# Patient Record
Sex: Female | Born: 1995 | Race: White | Hispanic: No | Marital: Married | Smoking: Never smoker
Health system: Southern US, Community
[De-identification: ages and names within clinical notes are randomized; demographics above are authoritative.]

## PROBLEM LIST (undated history)

## (undated) DIAGNOSIS — R55 Syncope and collapse: Secondary | ICD-10-CM

## (undated) DIAGNOSIS — K219 Gastro-esophageal reflux disease without esophagitis: Secondary | ICD-10-CM

## (undated) DIAGNOSIS — T7840XA Allergy, unspecified, initial encounter: Secondary | ICD-10-CM

## (undated) DIAGNOSIS — J45909 Unspecified asthma, uncomplicated: Secondary | ICD-10-CM

## (undated) DIAGNOSIS — G43909 Migraine, unspecified, not intractable, without status migrainosus: Secondary | ICD-10-CM

## (undated) DIAGNOSIS — N159 Renal tubulo-interstitial disease, unspecified: Secondary | ICD-10-CM

## (undated) HISTORY — DX: Renal tubulo-interstitial disease, unspecified: N15.9

## (undated) HISTORY — PX: WISDOM TOOTH EXTRACTION: SHX21

## (undated) HISTORY — DX: Migraine, unspecified, not intractable, without status migrainosus: G43.909

## (undated) HISTORY — PX: MOUTH SURGERY: SHX715

## (undated) HISTORY — DX: Allergy, unspecified, initial encounter: T78.40XA

## (undated) HISTORY — DX: Syncope and collapse: R55

## (undated) HISTORY — DX: Gastro-esophageal reflux disease without esophagitis: K21.9

---
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
## 2016-01-09 ENCOUNTER — Encounter: Payer: Self-pay | Admitting: Allergy and Immunology

## 2016-01-09 ENCOUNTER — Ambulatory Visit (INDEPENDENT_AMBULATORY_CARE_PROVIDER_SITE_OTHER): Payer: Medicaid Other | Admitting: Allergy and Immunology

## 2016-01-09 VITALS — BP 100/70 | HR 90 | Temp 98.0°F | Resp 16

## 2016-01-09 DIAGNOSIS — J4550 Severe persistent asthma, uncomplicated: Secondary | ICD-10-CM | POA: Diagnosis not present

## 2016-01-09 DIAGNOSIS — J3089 Other allergic rhinitis: Secondary | ICD-10-CM | POA: Diagnosis not present

## 2016-01-09 MED ORDER — FEXOFENADINE HCL 180 MG PO TABS: 180.0000 mg | ORAL_TABLET | Freq: Every day | ORAL | Status: DC

## 2016-01-09 MED ORDER — MOMETASONE FURO-FORMOTEROL FUM 200-5 MCG/ACT IN AERO: INHALATION_SPRAY | RESPIRATORY_TRACT | Status: DC

## 2016-01-09 MED ORDER — ALBUTEROL SULFATE HFA 108 (90 BASE) MCG/ACT IN AERS: INHALATION_SPRAY | RESPIRATORY_TRACT | Status: DC

## 2016-01-09 MED ORDER — MONTELUKAST SODIUM 10 MG PO TABS: ORAL_TABLET | ORAL | Status: DC

## 2016-01-09 NOTE — Progress Notes (Addendum)
FOLLOW UP NOTE  RE: Elizabeth Booker MRN: [id number] DOB: 13-Aug-1995 [facility] [place] [address] [place] [id number] Date of Office Visit: 01/09/2016  Subjective:  Elizabeth Booker is a 20 y.o. female who presents today for Asthma; Cough; and Wheezing  Assessment:   1. Severe persistent asthma, recent intermittent symptoms with normal lung exam and in office spirometry today--cough/wheeze free in office.   ----Total IgE=619.  2. Allergic rhinoconjunctivitis, intermittent congestion, on immunotherapy.   3.      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 Plan:   Meds ordered this encounter  Medications  . albuterol (PROAIR HFA) 108 (90 Base) MCG/ACT inhaler    Sig: Use 2 puffs every four hours as needed for cough or wheeze.  May use 2 puffs 10-20 minutes prior to exercise.    Dispense:  1 Inhaler    Refill:  1  . mometasone-formoterol (DULERA) 200-5 MCG/ACT AERO    Sig: Use 2 puffs twice daily to prevent cough or wheeze.  Rinse, gargle and spit after use.    Dispense:  5 g    Refill:  1  . montelukast (SINGULAIR) 10 MG tablet    Sig: Take one each evening to prevent cough or wheeze.    Dispense:  34 tablet    Refill:  5  . fexofenadine (ALLEGRA) 180 MG tablet    Sig: Take 1 tablet (180 mg total) by mouth daily.    Dispense:  30 tablet    Refill:  3  . EPINEPHrine 0.3 mg/0.3 mL IJ SOAJ injection    Sig: Inject 0.3 mLs (0.3 mg total) into the muscle once.    Dispense:  2 Device    Refill:  1  . omeprazole (PRILOSEC) 20 MG capsule    Sig: Take 1 capsule (20 mg total) by mouth daily.    Dispense:  30 capsule    Refill:  2  . predniSONE (DELTASONE) 10 MG tablet    Sig: Take 3 tablets daily for 2 days then 2 tablets daily for 2 days and one tablet on last day.    Dispense:  11 tablet    Refill:  0  1.    Further review of Xolair option, additional written brochure given today and our in office protocol discussed  today. 2.    Prednisone 30mg  today and additional days if persisting symptoms as discussed. 3.    QVAR 4 puffs twice daily until 100% well (spacer). 4.    Albuterol neb/ProAir HFA every 4 hours as needed--will use twice daily over the next several days. 5.    Continue Singulair, Dulera (spacer), and Flonase daily. 6.    Use Allegra 180mg  once daily and stop Zyrtec. 7.    Saline nasal wash each evening at shower time. 8.    She will use Omeprazole 20mg  once each morning during the next week in particular while taking Prednisone. 9.    Hold immunotherapy until symptom free--Epi-pen/Benadryl per protocol. 10.  Follow-up in the next month or sooner if needed.  HPI:  Elizabeth Booker returns to the office with Mom reporting intermittent cough, congestion and wheeze.  Since her last visit with Dr. Nunzio CobbsBobbitt in January she had not followed up as medications were beneficial and no new concerns.  She is receiving immunotherapy without any large local or systemic reactions. However reports symptoms over the last 2 months with the warmer weather.  [place]eze and slight shortness of breath with activity, therefore started QVAR yesterday evening along with her other medications.  She has noted nasal congestion without headache, sore throat, fever or discolored drainage--though has not been using her Flonase.  Mom was interested in more details regarding Xolair, and the potential side effects.  Elizabeth Booker is sleeping well, without nocturnal awakenings and has normal appetite. Denies ED or urgent care visits, prednisone or antibiotic courses since her visit in January.   Elizabeth Booker has a current medication list which includes the following prescription(s): albuterol neb, albuterol, fluticasone, ibuprofen, mometasone-formoterol, montelukast, OCP, qvar, diphenhydramine.  Drug Allergies: Allergies  Allergen Reactions  . Cucumber Extract Anaphylaxis  . Mango Flavor Anaphylaxis     fruit  . Onion Anaphylaxis    raw  . Other Anaphylaxis    cantaloupe  . Watermelon Concentrate [Citrullus Vulgaris] Anaphylaxis   Objective:   Filed Vitals:   01/09/16 1344  BP: 100/70  Pulse: 90  Temp: 98 F (36.7 C)  Resp: 16   SpO2 Readings from Last 1 Encounters:  01/09/16 98%   Physical Exam  Constitutional: She is well-developed, well-nourished, and in no distress.  Alert interactive communicating easily in full sentences.  HENT:  Head: Atraumatic.  Right Ear: Tympanic membrane and ear canal normal.  Left Ear: Tympanic membrane and ear canal normal.  Nose: Mucosal edema present. No rhinorrhea. No epistaxis.  Mouth/Throat: Oropharynx is clear and moist and mucous membranes are normal. No oropharyngeal exudate, posterior oropharyngeal edema or posterior oropharyngeal erythema.  Neck: Neck supple.  Cardiovascular: Normal rate, S1 normal and S2 normal.   No murmur heard. Pulmonary/Chest: Effort normal. She has no wheezes. She has no rhonchi. She has no rales.  Post Xopenex/Atrovent neb: Continues to be clear.  Patient without wheeze, rhonchi or crackles.  Lymphadenopathy:    She has no cervical adenopathy.   Diagnostics: Spirometry:  FVC  3.30--92%, FEV1 2.84--88%, FEF25--75%--70%; postbronchodilator essentially no change.    Charelle Petrakis M. Willa RoughHicks, MD  cc: Norman ClayLOWE,MELISSA V, MD

## 2016-01-09 NOTE — Patient Instructions (Addendum)
  Further review of Xolair option.  Prednisone 30mg  in today.  QVAR 4 puffs daily until well.  Albuterol neb/ProAir HFA every 4 hours as needed.  Continue Singulair, Dulera, and Flonase daily.  Allegra 180mg  once daily.  Saline nasal wash each evening at shower time.  Follow-up in the next month or sooner if needed.

## 2016-01-10 MED ORDER — OMEPRAZOLE 20 MG PO CPDR: 20.0000 mg | DELAYED_RELEASE_CAPSULE | Freq: Every day | ORAL | Status: DC

## 2016-01-10 MED ORDER — EPINEPHRINE 0.3 MG/0.3ML IJ SOAJ: 0.3000 mg | Freq: Once | INTRAMUSCULAR | Status: DC

## 2016-01-10 MED ORDER — PREDNISONE 10 MG PO TABS: ORAL_TABLET | ORAL | Status: DC

## 2016-01-11 ENCOUNTER — Emergency Department (HOSPITAL_COMMUNITY): Payer: Medicaid Other

## 2016-01-11 ENCOUNTER — Emergency Department (HOSPITAL_COMMUNITY)
Admission: EM | Admit: 2016-01-11 | Discharge: 2016-01-11 | Disposition: A | Discharge status: Home / Self Care | Payer: Medicaid Other | Attending: Emergency Medicine | Admitting: Emergency Medicine

## 2016-01-11 ENCOUNTER — Encounter (HOSPITAL_COMMUNITY): Payer: Self-pay

## 2016-01-11 DIAGNOSIS — Z79899 Other long term (current) drug therapy: Secondary | ICD-10-CM | POA: Diagnosis not present

## 2016-01-11 DIAGNOSIS — J209 Acute bronchitis, unspecified: Secondary | ICD-10-CM | POA: Insufficient documentation

## 2016-01-11 DIAGNOSIS — J45909 Unspecified asthma, uncomplicated: Secondary | ICD-10-CM | POA: Diagnosis present

## 2016-01-11 DIAGNOSIS — J40 Bronchitis, not specified as acute or chronic: Secondary | ICD-10-CM

## 2016-01-11 LAB — D-DIMER, QUANTITATIVE (NOT AT ARMC)

## 2016-01-11 MED ORDER — IPRATROPIUM-ALBUTEROL 0.5-2.5 (3) MG/3ML IN SOLN
3.0000 mL | Freq: Once | RESPIRATORY_TRACT | Status: AC
  Administered 2016-01-11: 3 mL via RESPIRATORY_TRACT
  Filled 2016-01-11: qty 3

## 2016-01-11 MED ORDER — AZITHROMYCIN 250 MG PO TABS: ORAL_TABLET | ORAL | Status: DC

## 2016-01-11 NOTE — ED Notes (Signed)
Had albuterol neb treatment at 0930

## 2016-01-11 NOTE — ED Notes (Addendum)
Patient here with asthma attack. Been using steroids and nebs with minimal relief. Increased coughing and chest tightness for same. Speaking but coughing on assessment. NAD

## 2016-01-11 NOTE — ED Provider Notes (Signed)
CSN: [id number]     Arrival date & time 01/11/16  1104 History   First MD Initiated Contact with Patient 01/11/16 1154     Chief Complaint  Patient presents with  . Asthma   (Consider location/radiation/quality/duration/timing/severity/associated sxs/prior Treatment) HPI 20 y.o. female with a hx of Asthma, presents to the Emergency Department today complaining of shortness of breath x 4 days. Has been using neb home treatments every day since then with minimal relief. Last treatment was 0930 this AM. States that she went to the Asthma clinic on Friday and was given Rx Prednisone. She has taken 3 tablets on the initial day, took another 3 the next day, and has yet to take the next 3 tablets today. Notes diffuse chest pressure that is 6/10. Pt states that she had a temp yesterday of 100F. Dry cough. No N/V/D. No headaches. No abd pain. No other symptoms noted   Past Medical History  Diagnosis Date  . Asthma    History reviewed. No pertinent past surgical history. No family history on file. Social History  Substance Use Topics  . Smoking status: Never Smoker   . Smokeless tobacco: None  . Alcohol Use: No   OB History    No data available     Review of Systems ROS reviewed and all are negative for acute change except as noted in the HPI.  Allergies  Cucumber extract; Mango flavor; Onion; Other; and Watermelon concentrate  Home Medications   Prior to Admission medications   Medication Sig Start Date End Date Taking? Authorizing Provider  albuterol (PROAIR HFA) 108 (90 Base) MCG/ACT inhaler Use 2 puffs every four hours as needed for cough or wheeze.  May use 2 puffs 10-20 minutes prior to exercise. 01/09/16   Roselyn Kara Mead, MD  albuterol (PROVENTIL) (2.5 MG/3ML) 0.083% nebulizer solution Take 2.5 mg by nebulization every 6 (six) hours as needed for wheezing or shortness of breath.    Historical Provider, MD  amoxicillin-clavulanate (AUGMENTIN) 875-125 MG tablet Take 1 tablet twice  daily for 10 days. Patient not taking: Reported on 01/09/2016 08/11/15   Cristal Ford, MD  diphenhydrAMINE (BENADRYL) 25 MG tablet Take 50 mg by mouth every 6 (six) hours as needed. For allergies    Historical Provider, MD  EPINEPHrine 0.3 mg/0.3 mL IJ SOAJ injection Inject 0.3 mLs (0.3 mg total) into the muscle once. 01/10/16   Roselyn Kara Mead, MD  fexofenadine (ALLEGRA) 180 MG tablet Take 1 tablet (180 mg total) by mouth daily. 01/09/16   Roselyn Kara Mead, MD  fluticasone (FLONASE) 50 MCG/ACT nasal spray Place into both nostrils daily.    Historical Provider, MD  fluticasone-salmeterol (ADVAIR HFA) 230-21 MCG/ACT inhaler Inhale 2 puffs into the lungs 2 (two) times daily. Reported on 01/09/2016    Historical Provider, MD  guaiFENesin (MUCINEX) 600 MG 12 hr tablet Take 600 mg by mouth 2 (two) times daily. Reported on 01/09/2016    Historical Provider, MD  ibuprofen (ADVIL,MOTRIN) 200 MG tablet Take 400 mg by mouth every 6 (six) hours as needed. For pain    Historical Provider, MD  levocetirizine (XYZAL) 5 MG tablet Take 1 tablet (5 mg total) by mouth every evening. Patient not taking: Reported on 01/09/2016 11/06/15   Cristal Ford, MD  mometasone-formoterol [facility]) 200-5 MCG/ACT AERO Use 2 puffs twice daily to prevent cough or wheeze.  Rinse, gargle and spit after use. 01/09/16   Roselyn Kara Mead, MD  montelukast (SINGULAIR) 10 MG tablet Take one each  evening to prevent cough or wheeze. 01/09/16   Roselyn Kara MeadM Hicks, MD  omeprazole (PRILOSEC) 20 MG capsule Take 1 capsule (20 mg total) by mouth daily. 01/10/16   Roselyn Kara MeadM Hicks, MD  predniSONE (DELTASONE) 10 MG tablet Take 3 tablets daily for 2 days then 2 tablets daily for 2 days and one tablet on last day. 01/10/16   Roselyn Kara MeadM Hicks, MD  PRESCRIPTION MEDICATION Take 1 tablet by mouth daily. Birth control    Historical Provider, MD  PROVENTIL HFA 108 (90 Base) MCG/ACT inhaler INHALE TWO PUFFS BY MOUTH EVERY 6 HOURS AS NEEDED FOR SHORTNESS OF BREATH OR   WHEEZING. Patient not taking: Reported on 01/09/2016 10/02/15   Cristal Fordalph Carter Bobbitt, MD  QVAR 80 MCG/ACT inhaler INHALE TWO PUFFS BY MOUTH TWICE DAILY DURING  FLARE-UP  AS  DIRECTED.  RINSE,  GARGLE  AND  SPIT  AFTER  USE 12/01/15   Cristal Fordalph Carter Bobbitt, MD   BP 117/80 mmHg  Pulse 99  Temp(Src) 98.3 F (36.8 C) (Oral)  Resp 18  SpO2 100%  LMP 12/21/2015 (Approximate)   Physical Exam  Constitutional: She is oriented to person, place, and time. She appears well-developed and well-nourished.  HENT:  Head: Normocephalic and atraumatic.  Eyes: EOM are normal. Pupils are equal, round, and reactive to light.  Neck: Normal range of motion. Neck supple. No tracheal deviation present.  Cardiovascular: Normal rate, regular rhythm and normal heart sounds.   No murmur heard. Pulmonary/Chest: Effort normal and breath sounds normal. She has no decreased breath sounds. She has no wheezes. She has no rhonchi. She has no rales.  Abdominal: Soft. Normal appearance and bowel sounds are normal. There is no tenderness. There is no rigidity, no rebound, no guarding, no tenderness at McBurney's point and negative Murphy's sign.  Musculoskeletal: Normal range of motion.  Neurological: She is alert and oriented to person, place, and time.  Skin: Skin is warm and dry.  Psychiatric: She has a normal mood and affect. Her behavior is normal. Thought content normal.  Nursing note and vitals reviewed.  ED Course  Procedures (including critical care time) Labs Review Labs Reviewed  D-DIMER, QUANTITATIVE (NOT AT [facility])    Imaging Review Dg Chest 2 View  01/11/2016  CLINICAL DATA:  Cough and short of breath EXAM: CHEST  2 VIEW COMPARISON:  06/27/2013 FINDINGS: The heart size and mediastinal contours are within normal limits. Both lungs are clear. The visualized skeletal structures are unremarkable. IMPRESSION: No active cardiopulmonary disease. Electronically Signed   By: Marlan Palauharles  Clark M.D.   On: 01/11/2016 11:59    I have personally reviewed and evaluated these images and lab results as part of my medical decision-making.   EKG Interpretation None      MDM  I have reviewed and evaluated the relevant laboratory valuesI have reviewed and evaluated the relevant imaging studies.  I have reviewed the relevant previous healthcare records. I obtained HPI from historian. Patient discussed with supervising physician  ED Course:  Assessment: Pt is a 19yF with hx Asthma who presents with asthma exacerbation x 4 days despite neb treatments at home and prednisone from asthma clinic on Friday. On exam, pt in NAD. Nontoxic/nonseptic appearing. VSS. Afebrile. Lungs CTA with no appreciable wheeze. Heart RRR. Abdomen nontender soft. CXR unremarkable. D Dimer negative. Supervising Physician has seen and evaluated patient. Auscultated wheeze on right base on exam. Likely asthma exacerbation vs walking PNA. Plan is to DC Home with ABX and follow up to PCP.  At time of discharge, Patient is in no acute distress. Vital Signs are stable. Patient is able to ambulate. Patient able to tolerate PO.    Disposition/Plan:  DC Home Additional Verbal discharge instructions given and discussed with patient.  Pt Instructed to f/u with PCP in the next week for evaluation and treatment of symptoms. Return precautions given Pt acknowledges and agrees with plan  Supervising Physician Arby BarretteMarcy Pfeiffer, MD   Final diagnoses:  Bronchitis      Audry Piliyler Mclean Moya, PA-C 01/11/16 1439  Arby BarretteMarcy Pfeiffer, MD 01/18/16 [id number]

## 2016-01-11 NOTE — Discharge Instructions (Signed)
Please read and follow all provided instructions.  Your diagnoses today include:  1. Bronchitis    Tests performed today include:  Vital signs. See below for your results today.   Medications prescribed:   Take as prescribed   Home care instructions:  Follow any educational materials contained in this packet.  Follow-up instructions: Please follow-up with your primary care provider for further evaluation of symptoms and treatment   Return instructions:   Please return to the Emergency Department if you do not get better, if you get worse, or new symptoms OR  - Fever (temperature greater than 101.57F)  - Bleeding that does not stop with holding pressure to the area    -Severe pain (please note that you may be more sore the day after your accident)  - Chest Pain  - Difficulty breathing  - Severe nausea or vomiting  - Inability to tolerate food and liquids  - Passing out  - Skin becoming red around your wounds  - Change in mental status (confusion or lethargy)  - New numbness or weakness     Please return if you have any other emergent concerns.  Additional Information:  Your vital signs today were: BP 110/67 mmHg   Pulse 93   Temp(Src) 98.3 F (36.8 C) (Oral)   Resp 18   SpO2 97%   LMP 12/21/2015 (Approximate) If your blood pressure (BP) was elevated above 135/85 this visit, please have this repeated by your doctor within one month. ---------------

## 2016-01-14 ENCOUNTER — Encounter: Payer: Self-pay | Admitting: Allergy and Immunology

## 2016-01-14 ENCOUNTER — Ambulatory Visit (INDEPENDENT_AMBULATORY_CARE_PROVIDER_SITE_OTHER): Payer: Medicaid Other | Admitting: Allergy and Immunology

## 2016-01-14 VITALS — BP 100/70 | HR 85 | Temp 97.8°F | Resp 16

## 2016-01-14 DIAGNOSIS — J3089 Other allergic rhinitis: Secondary | ICD-10-CM

## 2016-01-14 DIAGNOSIS — J4541 Moderate persistent asthma with (acute) exacerbation: Secondary | ICD-10-CM

## 2016-01-14 DIAGNOSIS — K219 Gastro-esophageal reflux disease without esophagitis: Secondary | ICD-10-CM | POA: Diagnosis not present

## 2016-01-14 MED ORDER — IPRATROPIUM-ALBUTEROL 0.5-2.5 (3) MG/3ML IN SOLN: 3.0000 mL | Freq: Four times a day (QID) | RESPIRATORY_TRACT | Status: DC | PRN

## 2016-01-14 MED ORDER — METHYLPREDNISOLONE ACETATE 40 MG/ML IJ SUSP
40.0000 mg | Freq: Once | INTRAMUSCULAR | Status: AC
  Administered 2016-01-14: 40 mg via INTRAMUSCULAR

## 2016-01-14 NOTE — Patient Instructions (Addendum)
  1. Depo-Medrol 40 I am delivered in clinic today  2. Can use DuoNeb nebulization at home every 4-6 hours if needed. Replaces albuterol  3. Continue Dulera 200 - 2 inhalations twice a day  4. Add in Qvar 80 - 2 inhalations twice a day during "flareup"  5. Finish course of azithromycin  6. Continue nasal fluticasone 1-2 sprays each nostril daily  7. Continue montelukast 10 mg daily and omeprazole 20 mg daily  8. Continue antihistamine, ibuprofen, Mucinex DM if needed  9. Continue immunotherapy and EpiPen  10. Further evaluation?  11. Get fall flu vaccine  12. Return to clinic in November or earlier if problem

## 2016-01-14 NOTE — Progress Notes (Signed)
Follow-up Note  Referring Provider: Loyola Mast, MD Primary Provider: Norman Clay, MD Date of Office Visit: 01/14/2016  Subjective:   Elizabeth Booker (DOB: 03-24-1996) is a 20 y.o. female who [facility] in re-evaluation of the following:  HPI: Delma presents to this clinic in reevaluation of a persistent respiratory tract flare that has been present for the past week. She developed problems with shortness of breath and coughing and rhinorrhea and sneezing with a low-grade temperature up to 100 for which she sought out care with Dr. Willa Rough on Friday who treated her with prednisone 30 mg followed by a tapering dose. She really did not have much improvement while utilizing this plan and ended up going to the emergency room where a chest x-ray was normal and apparently a test for pulmonary embolus was normal and she was given azithromycin. Presently she still feels as though she short of breath especially if she exerts herself to any degree. Fortunately, her nose is better. She has no anosmia or decreased ability to taste or ugly nasal discharge and she's not making any ugly sputum production or have any chest pain.  Apparently she had a flareup in March for which she saw Dr. Nunzio Cobbs and had her Advair changed to [facility] and did relatively well up until this event. She continues on immunotherapy and a large collection of anti-inflammatory medications for her reflux and atopic respiratory disease and for the most part this plan has been working quite well.    Medication List           albuterol 108 (90 Base) MCG/ACT inhaler  Commonly known as:  PROAIR HFA  Use 2 puffs every four hours as needed for cough or wheeze.  May use 2 puffs 10-20 minutes prior to exercise.     albuterol (2.5 MG/3ML) 0.083% nebulizer solution  Commonly known as:  PROVENTIL  Take 2.5 mg by nebulization every 6 (six) hours as needed for wheezing or shortness of breath.       azithromycin 250 MG tablet  Commonly known as:  ZITHROMAX Z-PAK   PO once daily on day 1. THEN  PO once daily for 4 days     EPINEPHrine 0.3 mg/0.3 mL Soaj injection  Commonly known as:  EPI-PEN  Inject 0.3 mLs (0.3 mg total) into the muscle once.     fexofenadine 180 MG tablet  Commonly known as:  ALLEGRA  Take 1 tablet (180 mg total) by mouth daily.     fluticasone 50 MCG/ACT nasal spray  Commonly known as:  FLONASE  Place into both nostrils daily.     ibuprofen 200 MG tablet  Commonly known as:  ADVIL,MOTRIN  Take 400 mg by mouth every 6 (six) hours as needed. For pain     mometasone-formoterol 200-5 MCG/ACT Aero  Commonly known as:  DULERA  Use 2 puffs twice daily to prevent cough or wheeze.  Rinse, gargle and spit after use.     montelukast 10 MG tablet  Commonly known as:  SINGULAIR  Take one each evening to prevent cough or wheeze.     omeprazole 20 MG capsule  Commonly known as:  PRILOSEC  Take 1 capsule (20 mg total) by mouth daily.     predniSONE 10 MG tablet  Commonly known as:  DELTASONE  Take 3 tablets daily for 2 days then 2 tablets daily for 2 days and one tablet on last day.  Past Medical History  Diagnosis Date  . Asthma     No past surgical history on file.  Allergies  Allergen Reactions  . Cucumber Extract Anaphylaxis  . Mango Flavor Anaphylaxis    fruit  . Onion Anaphylaxis    raw  . Other Anaphylaxis    cantaloupe  . Watermelon Concentrate [Citrullus Vulgaris] Anaphylaxis    Review of systems negative except as noted in HPI / PMHx or noted below:  Review of Systems  Constitutional: Negative.   HENT: Negative.   Eyes: Negative.   Respiratory: Negative.   Cardiovascular: Negative.   Gastrointestinal: Negative.   Genitourinary: Negative.   Musculoskeletal: Negative.   Skin: Negative.   Neurological: Negative.   Endo/Heme/Allergies: Negative.   Psychiatric/Behavioral: Negative.      Objective:   Filed  Vitals:   01/14/16 0956  BP: 100/70  Pulse: 85  Temp: 97.8 F (36.6 C)  Resp: 16          Physical Exam  Constitutional: She is well-developed, well-nourished, and in no distress.  HENT:  Head: Normocephalic.  Right Ear: Tympanic membrane, external ear and ear canal normal.  Left Ear: Tympanic membrane, external ear and ear canal normal.  Nose: Nose normal. No mucosal edema or rhinorrhea.  Mouth/Throat: Uvula is midline, oropharynx is clear and moist and mucous membranes are normal. No oropharyngeal exudate.  Eyes: Conjunctivae are normal.  Neck: Trachea normal. No tracheal tenderness present. No tracheal deviation present. No thyromegaly present.  Cardiovascular: Normal rate, regular rhythm, S1 normal, S2 normal and normal heart sounds.   No murmur heard. Pulmonary/Chest: Breath sounds normal. No stridor. No respiratory distress. She has no wheezes. She has no rales.  Musculoskeletal: She exhibits no edema.  Lymphadenopathy:       Head (right side): No tonsillar adenopathy present.       Head (left side): No tonsillar adenopathy present.    She has no cervical adenopathy.  Neurological: She is alert. Gait normal.  Skin: No rash noted. She is not diaphoretic. No erythema. Nails show no clubbing.  Psychiatric: Mood and affect normal.    Diagnostics:    Spirometry was performed and demonstrated an FEV1 of 2.79 at 95 % of predicted.  The patient had an Asthma Control Test with the following results:  .    Assessment and Plan:   1. Asthma, not well controlled, moderate persistent, with acute exacerbation   2. Allergic rhinoconjunctivitis   3. Gastroesophageal reflux disease, esophagitis presence not specified     1. Depo-Medrol 40 IM delivered in clinic today  2. Can use DuoNeb nebulization at home every 4-6 hours if needed. Replaces albuterol  3. Continue Dulera 200 - 2 inhalations twice a day  4. Add in Qvar 80 - 2 inhalations twice a day during "flareup"  5.  Finish course of azithromycin  6. Continue nasal fluticasone 1-2 sprays each nostril daily  7. Continue montelukast 10 mg daily and omeprazole 20 mg daily  8. Continue antihistamine, ibuprofen, Mucinex DM if needed  9. Continue immunotherapy and EpiPen  10. Further evaluation?  11. Get fall flu vaccine  12. Return to clinic in November or earlier if problem  Konrad FelixKatelyn most likely has a viral trigger giving rise to her respiratory tract flare although certainly azithromycin will cover the possibility of mycoplasma infection. I think we just need to support her along for the next several days and then she'll resolve this issue and then hopefully go back to her previous status which  was pretty good control of her atopic disease utilizing a collection of medical therapy. Certainly if she redevelops recurrent exacerbations as we move forward we'll need to possibly consider a different approach to her issue. We'll seehow things go over the course of the next 6 months or so.  Laurette SchimkeEric Analayah Brooke, MD [facility]

## 2016-01-19 ENCOUNTER — Other Ambulatory Visit: Payer: Self-pay | Admitting: Allergy and Immunology

## 2016-01-30 ENCOUNTER — Other Ambulatory Visit: Payer: Self-pay | Admitting: Allergy and Immunology

## 2016-01-30 NOTE — Telephone Encounter (Signed)
On 01/09/16 Allegra was sent in with 3 additional refills. Called mother left message regarding this she needs to contact [facility] to check to see if they have it on file

## 2016-01-30 NOTE — Telephone Encounter (Signed)
Pt mom called and needs to have allegra called in to [facility] [address] [phone].

## 2016-02-07 ENCOUNTER — Other Ambulatory Visit: Payer: Self-pay | Admitting: Allergy and Immunology

## 2016-03-05 ENCOUNTER — Other Ambulatory Visit: Payer: Self-pay | Admitting: Allergy and Immunology

## 2016-03-10 ENCOUNTER — Other Ambulatory Visit: Payer: Self-pay

## 2016-03-10 MED ORDER — MOMETASONE FURO-FORMOTEROL FUM 200-5 MCG/ACT IN AERO: INHALATION_SPRAY | RESPIRATORY_TRACT | 3 refills | Status: DC

## 2016-03-17 ENCOUNTER — Ambulatory Visit (INDEPENDENT_AMBULATORY_CARE_PROVIDER_SITE_OTHER): Payer: Medicaid Other | Admitting: Allergy & Immunology

## 2016-03-17 ENCOUNTER — Encounter (INDEPENDENT_AMBULATORY_CARE_PROVIDER_SITE_OTHER): Payer: Self-pay

## 2016-03-17 ENCOUNTER — Encounter: Payer: Self-pay | Admitting: Allergy & Immunology

## 2016-03-17 VITALS — BP 130/70 | HR 102 | Resp 20

## 2016-03-17 DIAGNOSIS — J310 Chronic rhinitis: Secondary | ICD-10-CM

## 2016-03-17 DIAGNOSIS — J4550 Severe persistent asthma, uncomplicated: Secondary | ICD-10-CM | POA: Diagnosis not present

## 2016-03-17 NOTE — Progress Notes (Signed)
FOLLOW UP  Date of Service/Encounter:  03/17/16   Assessment:   Severe persistent asthma, uncomplicated  Chronic rhinitis    Asthma Reportables:  Severity: : severe persistent  Risk: high due to history of non-compliance Control: not well controlled  Seasonal Influenza Vaccine: no but encouraged (not available yet)   Plan/Recommendations:    1. Severe persistent asthma, uncomplicated1 - Continue with all controller medications for now. - Use albuterol four puffs every four hours as needed. - Patient requesting IM Solu-Medrol, however I do not feel that her symptoms warranted this.  - Short dose of prednisone provided in clinic today (10mg  BID for 4.5 days). - Spirometry is very reassuring today and she has no wheezing or any focal pulmonary findings at this time (0.5 - 2 hours following treatment, depending on who provided the history).  - Return as scheduled with Dr. Lucie Leather in one month.  Yvonna Alanis would be an excellent omalizumab candidate.   2. Chronic rhinitis - Continue with allergy shots. - Continue with all of the allergy medications.  3. Return to clinic in one month with Dr. Lucie Leather.     Subjective:   Elizabeth Booker is a 20 y.o. female presenting today for follow up of  Chief Complaint  Patient presents with  . Asthma    bad for 2 weeks worse today. last nebulizer 30 minutes ago   .  Elizabeth Booker has a history of the following: Patient Active Problem List   Diagnosis Date Noted  . Asthma with acute exacerbation 08/05/2015  . Allergic rhinoconjunctivitis 08/05/2015  . Acute sinusitis 08/05/2015    History obtained from: chart review, patient, and parents.  Elizabeth Booker was referred by Norman Clay, MD.     Elizabeth Booker is a 20 y.o. female presenting for a follow up visit for asthma exacerbation. Elizabeth Booker was last seen by Dr. Lucie Leather in June 2017. At that time, she was doing fairly well. She was continued on Dulera, Singulair, and ProAir for  her asthma. There is discussion about starting Xolair with a brochure provided (total IgE 619). She is on immunotherapy supplemented with fexofenadine.  Elizabeth Booker reports today that she started having increased work of breathing around two hours ago. However this is in direct contrast to the story that her father told over the phone when he was requesting that she be seen today. In any case, she endorses wheezing and midline chest pain. She did take a nebulizer treatment with improvement in symptoms. However the improvement was transient. She has had no fever or viral URI symptoms. Parents are unsure what triggered her symptoms but Dad thinks that there might be a change in the seasons involved as a trigger.   Elizabeth Booker endorses excellent compliance with the medications. She is able to name them all and denies missed doses. However there is a history of non-compliance. She was recently treated at the beginning of July for bronchitis with a course of azithromycin. CXR was completely clear at that time. Wiletta typically receives 2-3 courses of prednisone in a calendar year. Her asthma is usually worst in the winter months, however she has been having worsening symptoms more often over the past summer.   Otherwise, there have been no changes to the past medical history, surgical history, family history, or social history.     Review of Systems: a 14-point review of systems is pertinent for what is mentioned in HPI.  Otherwise, all other systems were negative. Constitutional: negative other than that listed  in the HPI Eyes: negative other than that listed in the HPI Ears, nose, mouth, throat, and face: negative other than that listed in the HPI Respiratory: negative other than that listed in the HPI Cardiovascular: negative other than that listed in the HPI Gastrointestinal: negative other than that listed in the HPI Genitourinary: negative other than that listed in the HPI Integument: negative other than  that listed in the HPI Hematologic: negative other than that listed in the HPI Musculoskeletal: negative other than that listed in the HPI Neurological: negative other than that listed in the HPI Allergy/Immunologic: negative other than that listed in the HPI    Objective:   Blood pressure 130/70, pulse (!) 102, resp. rate 20, SpO2 96 %. There is no height or weight on file to calculate BMI.   Physical Exam:  General: Alert, interactive, in no acute distress. Very quiet and most questions are answered by Quita's parents. HEENT: TMs pearly gray, turbinates moderately edematous with clear discharge, post-pharynx mildly erythematous. Neck: Supple without thyromegaly. Adenopathy: no enlarged lymph nodes appreciated in the anterior cervical, occipital, axillary, epitrochlear, inguinal, or popliteal regions Lungs: Clear to auscultation without wheezing, rhonchi or rales. No increased work of breathing. Talking in full setences.  CV: Normal S1, S2 without murmurs. Capillary refill <2 seconds.  Skin: Warm and dry, without lesions or rashes. Extremities:  No clubbing, cyanosis or edema. Neuro:   Grossly intact.   Diagnostic studies:  Spirometry: results normal (FEV1: 3.05/104%, FVC: 3.28/101%, FEV1/FVC: 92%).    Spirometry consistent with normal pattern      Malachi BondsJoel Alvino Lechuga, MD [facility] [place]

## 2016-03-17 NOTE — Patient Instructions (Signed)
1. Severe persistent asthma, uncomplicated - Continue with all controller medications for now. - Use albuterol four puffs every four hours as needed. - Start the steroid burst.  - Return as scheduled with Dr. Lucie LeatherKozlow in one month.   2. Chronic rhinitis - Continue with allergy shots. - Continue with all of the medications.  3. Return to clinic in one month with Dr. Lucie LeatherKozlow.   It was nice meeting you! Call us if she is not getting better.

## 2016-03-18 ENCOUNTER — Telehealth: Payer: Self-pay | Admitting: Allergy & Immunology

## 2016-03-18 ENCOUNTER — Encounter: Payer: Self-pay | Admitting: Allergy & Immunology

## 2016-03-18 MED ORDER — AZITHROMYCIN 250 MG PO TABS: ORAL_TABLET | ORAL | 0 refills | Status: DC

## 2016-03-18 NOTE — Telephone Encounter (Signed)
I contacted dad to discuss Nucala and he requested I talk to mom about drug and patient new insurance. L/M for mom to give me a call

## 2016-03-18 NOTE — Telephone Encounter (Signed)
I called Elizabeth Booker's father back. Briefly, he called this morning and complained to the front desk staff as well as the nursing staff about the care his daughter received yesterday. He was extremely rude to the employees at the front desk and the nursing staff this morning. This is in addition to his rude behavior over the phone yesterday when he was trying to get [facility] an appointment. We finally offered an end of the day appointment yesterday. This morning, he told the front desk staff that "Dr. Dellis AnesGallagher doesn't know what he is doing" and "Dr. Lucie LeatherKozlow knows about her [Elizabeth Booker's] asthma". Elizabeth SandyBeth Booker was kind enough to call him back during our busy clinic morning. He reported that Elizabeth Booker had a bad night and required her albuterol nebulizer three times. He requested a call back from myself.   I was finally able to call him back during the lunch hour. He told me that he was frustrated that they did not get an antibiotic yesterday since they "knew where she was headed". I explained that colds are caused by viruses which are not affected by antibiotics. At the time that I saw her, she had only been having symptoms for less than 24 hours (confirmed with Dad that this was the case) and her spirometry was completely normal as was her physical exam. She was afebrile making a bacterial illness as well as pneumonia less likely. I calmly explained to Elizabeth Booker's father the risks of using antibiotics too often and needlessly with an emphasis on the development of resistant organisms. He still seemed quite skeptical after this discussion.  Elizabeth Booker's father explained that she was given azithromycin which did clear her symptoms for a couple of weeks but her symptoms have slowly worsened since that time. However, she did develop acutely worsening symptoms yesterday, prompting the visit. Therefore it is difficult to tell whether her current episode is a separate incident versus inadequately treated sinusitis that has lingered  since July.   I explained to Elizabeth Booker's father that oftentimes uncontrolled asthma can present with recurrent respiratory infections like she is experiencing now. She is already on Elizabeth Booker which is a combined ICS/LABA. Given that she has early on strong medication, I think she would benefit from a biologic therapy. Per the previous notes, Xolair has been discussed with the family. I asked that if they are interested in pursuing this and he agrees with the plan. He also tells me that there was a different medication that they have discussed as well. I presume this is Nucalal, however Dad did not remember the name of the medication. I told Elizabeth Booker's father that to me is our contact for all of the injectable medications. Therefore, she should have records of any conversations they have had.  Because of her worsening status, I did send in another azithromycin course. I asked dad to hold off on taking it up unless her symptoms acutely worsen. I will also contact Tammy to get Xolair or Nucala approved the insurance company. Dad is in agreement with the plan.  Malachi BondsJoel Noboru Bidinger, MD FAAAAI [facility] [place] [place]

## 2016-03-19 NOTE — Telephone Encounter (Signed)
Mom called and I explained the Nucala process, will mail copay form to return back to me and she will get me [employer] card as soon as they get it

## 2016-03-23 ENCOUNTER — Encounter (INDEPENDENT_AMBULATORY_CARE_PROVIDER_SITE_OTHER): Payer: Self-pay

## 2016-03-23 ENCOUNTER — Encounter: Payer: Self-pay | Admitting: Allergy and Immunology

## 2016-03-23 ENCOUNTER — Ambulatory Visit (INDEPENDENT_AMBULATORY_CARE_PROVIDER_SITE_OTHER): Payer: Medicaid Other | Admitting: Allergy and Immunology

## 2016-03-23 VITALS — BP 112/62 | HR 88 | Temp 97.4°F | Ht 62.5 in | Wt 108.0 lb

## 2016-03-23 DIAGNOSIS — R0789 Other chest pain: Secondary | ICD-10-CM | POA: Diagnosis not present

## 2016-03-23 DIAGNOSIS — K219 Gastro-esophageal reflux disease without esophagitis: Secondary | ICD-10-CM

## 2016-03-23 DIAGNOSIS — J3089 Other allergic rhinitis: Secondary | ICD-10-CM | POA: Diagnosis not present

## 2016-03-23 DIAGNOSIS — J4550 Severe persistent asthma, uncomplicated: Secondary | ICD-10-CM | POA: Diagnosis not present

## 2016-03-23 DIAGNOSIS — Z91018 Allergy to other foods: Secondary | ICD-10-CM | POA: Diagnosis not present

## 2016-03-23 MED ORDER — OMEPRAZOLE 20 MG PO CPDR: 20.0000 mg | DELAYED_RELEASE_CAPSULE | Freq: Every day | ORAL | 5 refills | Status: DC

## 2016-03-23 MED ORDER — RANITIDINE HCL 300 MG PO TABS: 300.0000 mg | ORAL_TABLET | Freq: Every day | ORAL | 5 refills | Status: DC

## 2016-03-23 NOTE — Progress Notes (Signed)
Follow-up Note  Referring Provider: Loyola Mast, MD Primary Provider: Norman Clay, MD Date of Office Visit: 03/23/2016  Subjective:   Elizabeth Booker (DOB: 01-May-1996) is a 20 y.o. female who [facility] in re-evaluation of the following:  HPI: Elizabeth Booker presents to this clinic in evaluation of her asthma and allergic rhinitis treated with immunotherapy. She apparently saw Dr. Dellis Anes on 03/17/2016 for an episode of wheezing and coughing in association with significant sternal chest discomfort. She was administered systemic steroids and a broad-spectrum antibiotic and apparently has not responded optimally. It is interesting to note that her major concern is the fact that she has chest pain. This chest pain is making her breathing very shallow. When she uses a short acting bronchodilator it does not really help her breathing or her chest pain. She apparently has an issue with regurgitation and heartburn and some slow transit time to her esophagus with food occasionally getting stuck in her chest for a few seconds. She does consume caffeinated drinks 3 times per week and eats chocolate 3 times per week.    Medication List      albuterol 108 (90 Base) MCG/ACT inhaler Commonly known as:  PROAIR HFA Use 2 puffs every four hours as needed for cough or wheeze.  May use 2 puffs 10-20 minutes prior to exercise.   EPINEPHrine 0.3 mg/0.3 mL Soaj injection Commonly known as:  EPI-PEN Inject 0.3 mLs (0.3 mg total) into the muscle once.   fexofenadine 180 MG tablet Commonly known as:  ALLEGRA Take 1 tablet (180 mg total) by mouth daily.   fluticasone 50 MCG/ACT nasal spray Commonly known as:  FLONASE Place into both nostrils daily.   ibuprofen 200 MG tablet Commonly known as:  ADVIL,MOTRIN Take 400 mg by mouth every 6 (six) hours as needed. For pain   ipratropium-albuterol 0.5-2.5 (3) MG/3ML Soln Commonly known as:  DUONEB Take 3 mLs by  nebulization every 6 (six) hours as needed.   levocetirizine 5 MG tablet Commonly known as:  XYZAL Take 5 mg by mouth every evening.   mometasone-formoterol 200-5 MCG/ACT Aero Commonly known as:  DULERA Use 2 puffs twice daily to prevent cough or wheeze.  Rinse, gargle and spit after use.   montelukast 10 MG tablet Commonly known as:  SINGULAIR Take one each evening to prevent cough or wheeze.   omeprazole 20 MG capsule Commonly known as:  PRILOSEC Take 1 capsule (20 mg total) by mouth daily.   QVAR 80 MCG/ACT inhaler Generic drug:  beclomethasone INHALE TWO PUFFS BY MOUTH TWICE DAILY DURING FLARE-UP AS DIRECTED. RINSE, GARGLE AND SPIT AFTER USE       Past Medical History:  Diagnosis Date  . Asthma     History reviewed. No pertinent surgical history.  Allergies  Allergen Reactions  . Cucumber Extract Anaphylaxis  . Mango Flavor Anaphylaxis    fruit  . Onion Anaphylaxis    raw  . Other Anaphylaxis    cantaloupe  . Watermelon Concentrate [Citrullus Vulgaris] Anaphylaxis    Review of systems negative except as noted in HPI / PMHx or noted below:  Review of Systems  Constitutional: Negative.   HENT: Negative.   Eyes: Negative.   Respiratory: Negative.   Cardiovascular: Negative.   Gastrointestinal: Negative.   Genitourinary: Negative.   Musculoskeletal: Negative.   Skin: Negative.   Neurological: Negative.   Endo/Heme/Allergies: Negative.   Psychiatric/Behavioral: Negative.      Objective:  Vitals:   03/23/16 0810  BP: 112/62  Pulse: 88  Temp: 97.4 F (36.3 C)   Height: 5' 2.5" (158.8 cm)  Weight: 108 lb (49 kg)   Physical Exam  Constitutional: She is well-developed, well-nourished, and in no distress.  HENT:  Head: Normocephalic.  Right Ear: Tympanic membrane, external ear and ear canal normal.  Left Ear: Tympanic membrane, external ear and ear canal normal.  Nose: Nose normal. No mucosal edema or rhinorrhea.  Mouth/Throat: Uvula is  midline, oropharynx is clear and moist and mucous membranes are normal. No oropharyngeal exudate.  Eyes: Conjunctivae are normal.  Neck: Trachea normal. No tracheal tenderness present. No tracheal deviation present. No thyromegaly present.  Cardiovascular: Normal rate, regular rhythm, S1 normal, S2 normal and normal heart sounds.   No murmur heard. Pulmonary/Chest: Breath sounds normal. No stridor. No respiratory distress. She has no wheezes. She has no rales.  Musculoskeletal: She exhibits no edema.  Lymphadenopathy:       Head (right side): No tonsillar adenopathy present.       Head (left side): No tonsillar adenopathy present.    She has no cervical adenopathy.  Neurological: She is alert. Gait normal.  Skin: No rash noted. She is not diaphoretic. No erythema. Nails show no clubbing.  Psychiatric: Mood and affect normal.    Diagnostics:    Spirometry was performed and demonstrated an FEV1 of 2.74 at 89 % of predicted.  The patient had an Asthma Control Test with the following results: ACT Total Score: 8.    Assessment and Plan:   1. Severe persistent asthma, uncomplicated   2. Other allergic rhinitis   3. Gastroesophageal reflux disease, esophagitis presence not specified   4. Other chest pain   5. Food allergy     1. Treat reflux:   A. Eliminate caffeine and chocolate  B. Omeprazole 20mg  one tablet in AM  C. Ranitidine 300mg  one tablet in PM  2. Can use DuoNeb nebulization at home every 4-6 hours if needed.    3. Continue Dulera 200 - 2 inhalations twice a day  4. Add in Qvar 80 - 2 inhalations twice a day during "flareup"  5. Continue nasal fluticasone 1-2 sprays each nostril daily  6. Continue montelukast 10 mg daily and omeprazole 20 mg daily  7. Continue antihistamine, ibuprofen, Mucinex DM if needed  8. Continue immunotherapy and EpiPen  9. Biological agent?  10. Get fall flu vaccine  11. Return to clinic in November or earlier if problem  I will  assume that Elizabeth Booker has been having a problem with reflux contributing to some of her chest pain and breathing issues and I will treat her aggressively for this issue as noted above which includes a proton pump inhibitor and a H2 receptor blocker as well as some behavioral modification at the same time that she continues to use anti-inflammatory agents for her respiratory tract. There was a suggestion about using a biological agent to control her asthma and this may be an option but first I would like to see what happens with therapy directed against reflux. If she still continues to have problems with what sounds like esophageal irritation and esophageal dysmotility then she will require an upper endoscopy to look for eosinophilic esophagitis. Her parents will contact me by telephone over the course of the next several weeks noting her response. I would like to continue her on this therapy for 12 weeks and then we'll make decision about how to approach this issue  and taper down her medications if she does have a positive response.  Laurette Schimke, MD [facility]

## 2016-03-23 NOTE — Patient Instructions (Signed)
  1. Treat reflux:   A. Eliminate caffeine and chocolate  B. Omeprazole 20mg  one tablet in AM  C. Ranitidine 300mg  one tablet in PM  2. Can use DuoNeb nebulization at home every 4-6 hours if needed.    3. Continue Dulera 200 - 2 inhalations twice a day  4. Add in Qvar 80 - 2 inhalations twice a day during "flareup"  5. Continue nasal fluticasone 1-2 sprays each nostril daily  6. Continue montelukast 10 mg daily and omeprazole 20 mg daily  7. Continue antihistamine, ibuprofen, Mucinex DM if needed  8. Continue immunotherapy and EpiPen  9. Biological agent?  10. Get fall flu vaccine  11. Return to clinic in November or earlier if problem

## 2016-03-31 ENCOUNTER — Telehealth: Payer: Self-pay | Admitting: Allergy and Immunology

## 2016-03-31 NOTE — Telephone Encounter (Signed)
Pt is having chest pains from acid reflux. She feels like their is gas trapped in her chest. She did state that the ranitidine and omeprazole are helping with her acid reflux.  Please advise

## 2016-03-31 NOTE — Telephone Encounter (Signed)
Patient was seen by Dr. Lucie LeatherKozlow on 03/23/16. She wants to let him know that her medication is ok on her stomach and wants to know what to take for pain for her GERD.

## 2016-03-31 NOTE — Telephone Encounter (Signed)
I do not understand message. She is already using ranitidine and omeprazole for reflux. She having pain while using these medications?

## 2016-03-31 NOTE — Telephone Encounter (Signed)
Tried contacting pt but their is no voicemail set up

## 2016-03-31 NOTE — Telephone Encounter (Signed)
Please advise 

## 2016-04-01 NOTE — Telephone Encounter (Signed)
Spoke with pts dad and informed him of what dr Lucie LeatherKozlow stated.Pts dad did stay she is still exteremly short of breath and sleeping in a chair.

## 2016-04-01 NOTE — Telephone Encounter (Signed)
Spoke with dad and he said pt is already signed up to start Xolair and that the letter we given to the school. And thank you!

## 2016-04-01 NOTE — Telephone Encounter (Signed)
Please inform dad that Elizabeth Booker did not demonstrate breathing tests suggesting that she is at risk of a bad event with her asthma so although her shortness of breath is not making her feel very well I don't think we are in danger of something bad happening. That said, she is still having significant problems on a large amount of medications and I think it is time that we did step forward and start her on a biological agent. Please submit approval for Xolair if Elizabeth Booker agrees to go down this pathway. As well, I had forwarded a letter regarding  [employer] and I need to know if that led her was adequate for her need.

## 2016-04-01 NOTE — Telephone Encounter (Signed)
Please inform patient that it is quite possible some of her chest pain is either from esophageal spasm or possibly inflammation of her chest wall. I think I would recommend adding in ibuprofen at a dose of 600 mg 2 or 3 times a day to see if this does help. If this does help then it is definitely her chest wall that is inflamed.

## 2016-04-02 ENCOUNTER — Ambulatory Visit (INDEPENDENT_AMBULATORY_CARE_PROVIDER_SITE_OTHER): Payer: Medicaid Other

## 2016-04-02 ENCOUNTER — Other Ambulatory Visit: Payer: Self-pay

## 2016-04-02 DIAGNOSIS — J4550 Severe persistent asthma, uncomplicated: Secondary | ICD-10-CM | POA: Diagnosis not present

## 2016-04-02 MED ORDER — MEPOLIZUMAB 100 MG ~~LOC~~ SOLR: 100.0000 mg | SUBCUTANEOUS | 0 refills | Status: DC

## 2016-04-16 ENCOUNTER — Telehealth: Payer: Self-pay | Admitting: Allergy and Immunology

## 2016-04-16 NOTE — Telephone Encounter (Signed)
She has been to urgent care on Tuesday morning and was diagnosed with a respiratory infection and was prescribed brom/pse/dm syp mor a cough syrup  and a mouth rinse Diphen 12/5 mg with prednisone and lidocaine. She is still coughing and has a scratchy sore throat and running a fever. Would you recommend an antibiotic?  She uses [facility] on [address] [place].

## 2016-04-16 NOTE — Telephone Encounter (Signed)
I think she needs to be seen again by a provider (PCP/UC) since she is now febrile with more symptoms.  She needs to be re-examined and possibly throat swab/culture if sore throat and any evidence of infection on exam.  Given her history of asthma she may also need rapid testing flu if she has symptoms consistent with that.

## 2016-05-10 ENCOUNTER — Ambulatory Visit: Payer: Self-pay

## 2016-05-18 ENCOUNTER — Ambulatory Visit (INDEPENDENT_AMBULATORY_CARE_PROVIDER_SITE_OTHER): Payer: BLUE CROSS/BLUE SHIELD | Admitting: Allergy and Immunology

## 2016-05-18 ENCOUNTER — Other Ambulatory Visit: Payer: Self-pay | Admitting: Allergy and Immunology

## 2016-05-18 ENCOUNTER — Encounter: Payer: Self-pay | Admitting: Allergy and Immunology

## 2016-05-18 ENCOUNTER — Encounter (INDEPENDENT_AMBULATORY_CARE_PROVIDER_SITE_OTHER): Payer: Self-pay

## 2016-05-18 VITALS — BP 100/68 | HR 76 | Resp 20

## 2016-05-18 DIAGNOSIS — J4550 Severe persistent asthma, uncomplicated: Secondary | ICD-10-CM

## 2016-05-18 DIAGNOSIS — R0789 Other chest pain: Secondary | ICD-10-CM | POA: Diagnosis not present

## 2016-05-18 DIAGNOSIS — K219 Gastro-esophageal reflux disease without esophagitis: Secondary | ICD-10-CM

## 2016-05-18 DIAGNOSIS — J3089 Other allergic rhinitis: Secondary | ICD-10-CM

## 2016-05-18 DIAGNOSIS — Z91018 Allergy to other foods: Secondary | ICD-10-CM | POA: Diagnosis not present

## 2016-05-18 MED ORDER — DEXLANSOPRAZOLE 60 MG PO CPDR: 60.0000 mg | DELAYED_RELEASE_CAPSULE | Freq: Every day | ORAL | 5 refills | Status: DC

## 2016-05-18 MED ORDER — MEPOLIZUMAB 100 MG ~~LOC~~ SOLR
100.0000 mg | SUBCUTANEOUS | Status: AC
  Administered 2016-05-18 – 2018-06-20 (×22): 100 mg via SUBCUTANEOUS

## 2016-05-18 NOTE — Progress Notes (Signed)
Follow-up Note  Referring Provider: Loyola Mast, MD Primary Provider: Norman Clay, MD Date of Office Visit: 05/18/2016  Subjective:   Elizabeth Booker (DOB: [birth year]) is a 20 y.o. female [facility] in re-evaluation of the following:  HPI: Elizabeth Booker presents to this clinic in reevaluation of her asthma and allergic rhinitis both treated with immunotherapy and her reflux. I've not seen her in his clinic since 03/23/2016.  She's been having lots of problems with her reflux. She's having burning and regurgitation. As well, she sometimes have some obstruction to swallowing especially with pills. Other food substances also appear to obstruct for several seconds and she has to drink waterTo have him passed through her chest.. These problems are continuing even though she's aggressively treating her reflux and does not consume any caffeine or chocolate.  This past Saturday she went running and had very significant reflux and then developed sternal chest pain and had problems with her asthma. She's been using her bronchodilator which just makes her get lots more reflux.  She's had very little problems with her nose at this point in time.   She continues on a combination of immunotherapy and omalizumab as well as other antiinflammatory medications for her airway.    Medication List      albuterol 108 (90 Base) MCG/ACT inhaler Commonly known as:  PROAIR HFA Use 2 puffs every four hours as needed for cough or wheeze.  May use 2 puffs 10-20 minutes prior to exercise.   EPINEPHrine 0.3 mg/0.3 mL Soaj injection Commonly known as:  EPI-PEN Inject 0.3 mLs (0.3 mg total) into the muscle once.   fexofenadine 180 MG tablet Commonly known as:  ALLEGRA Take 1 tablet (180 mg total) by mouth daily.   fluticasone 50 MCG/ACT nasal spray Commonly known as:  FLONASE Place into both nostrils daily.   ibuprofen 200 MG tablet Commonly known as:   ADVIL,MOTRIN Take 400 mg by mouth every 6 (six) hours as needed. For pain   ipratropium-albuterol 0.5-2.5 (3) MG/3ML Soln Commonly known as:  DUONEB Take 3 mLs by nebulization every 6 (six) hours as needed.   levocetirizine 5 MG tablet Commonly known as:  XYZAL Take 5 mg by mouth every evening.   Mepolizumab 100 MG Solr Commonly known as:  NUCALA Inject 100 mg into the skin every 28 (twenty-eight) days.   mometasone-formoterol 200-5 MCG/ACT Aero Commonly known as:  DULERA Use 2 puffs twice daily to prevent cough or wheeze.  Rinse, gargle and spit after use.   montelukast 10 MG tablet Commonly known as:  SINGULAIR Take one each evening to prevent cough or wheeze.   NUVARING 0.12-0.015 MG/24HR vaginal ring Generic drug:  etonogestrel-ethinyl estradiol   omeprazole 20 MG capsule Commonly known as:  PRILOSEC Take 1 capsule (20 mg total) by mouth daily.   QVAR 80 MCG/ACT inhaler Generic drug:  beclomethasone INHALE TWO PUFFS BY MOUTH TWICE DAILY DURING FLARE-UP AS DIRECTED. RINSE, GARGLE AND SPIT AFTER USE   ranitidine 300 MG tablet Commonly known as:  ZANTAC Take 1 tablet (300 mg total) by mouth at bedtime.       Past Medical History:  Diagnosis Date  . Asthma     History reviewed. No pertinent surgical history.  Allergies  Allergen Reactions  . Cucumber Extract Anaphylaxis  . Mango Flavor Anaphylaxis    fruit  . Onion Anaphylaxis    raw  . Other Anaphylaxis    cantaloupe  .  Watermelon Concentrate [Citrullus Vulgaris] Anaphylaxis    Review of systems negative except as noted in HPI / PMHx or noted below:  Review of Systems  Constitutional: Negative.   HENT: Negative.   Eyes: Negative.   Respiratory: Negative.   Cardiovascular: Negative.   Gastrointestinal: Negative.   Genitourinary: Negative.   Musculoskeletal: Negative.   Skin: Negative.   Neurological: Negative.   Endo/Heme/Allergies: Negative.   Psychiatric/Behavioral: Negative.       Objective:   Vitals:   05/18/16 1523  BP: 100/68  Pulse: 76  Resp: 20          Physical Exam  Constitutional: She is well-developed, well-nourished, and in no distress.  HENT:  Head: Normocephalic.  Right Ear: Tympanic membrane, external ear and ear canal normal.  Left Ear: Tympanic membrane, external ear and ear canal normal.  Nose: Nose normal. No mucosal edema or rhinorrhea.  Mouth/Throat: Uvula is midline, oropharynx is clear and moist and mucous membranes are normal. No oropharyngeal exudate.  Eyes: Conjunctivae are normal.  Neck: Trachea normal. No tracheal tenderness present. No tracheal deviation present. No thyromegaly present.  Cardiovascular: Normal rate, regular rhythm, S1 normal, S2 normal and normal heart sounds.   No murmur heard. Pulmonary/Chest: Breath sounds normal. No stridor. No respiratory distress. She has no wheezes. She has no rales.  Musculoskeletal: She exhibits no edema.  Lymphadenopathy:       Head (right side): No tonsillar adenopathy present.       Head (left side): No tonsillar adenopathy present.    She has no cervical adenopathy.  Neurological: She is alert. Gait normal.  Skin: No rash noted. She is not diaphoretic. No erythema. Nails show no clubbing.  Psychiatric: Mood and affect normal.    Diagnostics:    Spirometry was performed and demonstrated an FEV1 of 2.22 at 73 % of predicted.  Assessment and Plan:   1. Severe persistent asthma, uncomplicated   2. Other allergic rhinitis   3. Gastroesophageal reflux disease, esophagitis presence not specified   4. Other chest pain   5. Food allergy      1. Treat reflux:   A. Eliminate caffeine and chocolate  B. change omeprazole to Dexilant 60 mg one tablet in a.m.  C. Ranitidine 300mg  one tablet in PM  2. Can use DuoNeb nebulization at home every 4-6 hours if needed.    3. Continue Dulera 200 - 2 inhalations twice a day  4. Add in Qvar 80 - 2 inhalations twice a day during  "flareup"  5. Continue nasal fluticasone 1-2 sprays each nostril daily  6. Continue montelukast 10 mg daily and omeprazole 20 mg daily  7. Continue antihistamine, ibuprofen, Mucinex DM if needed  8. Continue immunotherapy and EpiPen  9. Continue nucala with dose today  10. Visit with gastroenterologist for reflux - Dr. Leone PayorGessner  11. Return to clinic in 12 weeks or earlier if problem  Elizabeth FelixKatelyn appears to have very significant reflux that is probably one of the triggers for her respiratory tract problems. In addition she has problems with swallowing obstruction as well and she needs to see a gastroenterologist and we'll try to get that arranged as soon as possible. I'll give her a combination of Dexilant and ranitidine as noted above. She'll continue on all of her anti-inflammatory medications for her respiratory tract including mepolizumab infusion as noted above. If she does well I'll see her back in this clinic in 12 weeks or earlier if there is a problem.  Elizabeth SchimkeEric Kozlow,  MD [facility]

## 2016-05-18 NOTE — Patient Instructions (Addendum)
  1. Treat reflux:   A. Eliminate caffeine and chocolate  B. change omeprazole to Dexilant 60 mg one tablet in a.m.  C. Ranitidine 300mg  one tablet in PM  2. Can use DuoNeb nebulization at home every 4-6 hours if needed.    3. Continue Dulera 200 - 2 inhalations twice a day  4. Add in Qvar 80 - 2 inhalations twice a day during "flareup"  5. Continue nasal fluticasone 1-2 sprays each nostril daily  6. Continue montelukast 10 mg daily and omeprazole 20 mg daily  7. Continue antihistamine, ibuprofen, Mucinex DM if needed  8. Continue immunotherapy and EpiPen  9. Continue nucala with dose today  10. Visit with gastroenterologist for reflux - Dr. Leone PayorGessner  11. Return to clinic in 12 weeks or earlier if problem

## 2016-05-19 ENCOUNTER — Telehealth: Payer: Self-pay

## 2016-05-19 NOTE — Telephone Encounter (Signed)
-----   Message from Iva Booparl E Gessner, MD sent at 05/19/2016  8:23 AM EST ----- Regarding: direct EGD? Referred by Dr. Lucie LeatherKozlow  Has dysphagia and GERD sxs  Direct EGD possible dilation seems ok if she wants  This might be a repeat message from me  Let me know if she will do that vs OV

## 2016-05-19 NOTE — Telephone Encounter (Signed)
Patient's mom notified of recommendations She is scheduled for EGD and pre-visit

## 2016-05-19 NOTE — Telephone Encounter (Signed)
I spoke with the patient's mother.  She is not able to talk at this time.  She will call me back in a few hours

## 2016-05-20 ENCOUNTER — Other Ambulatory Visit: Payer: Self-pay

## 2016-05-20 NOTE — Telephone Encounter (Signed)
[facility]  - spoke to Carol/Pharmacist   Advsd PA for Dexilant 60 mg DR cap has been approved.

## 2016-05-26 ENCOUNTER — Ambulatory Visit (AMBULATORY_SURGERY_CENTER): Payer: Self-pay | Admitting: *Deleted

## 2016-05-26 DIAGNOSIS — R1310 Dysphagia, unspecified: Secondary | ICD-10-CM

## 2016-05-26 DIAGNOSIS — K219 Gastro-esophageal reflux disease without esophagitis: Secondary | ICD-10-CM

## 2016-05-26 NOTE — Progress Notes (Signed)
No egg or soy allergy  Pt has never been intubated but denies trouble moving neck  No diet medications taken

## 2016-06-10 ENCOUNTER — Encounter: Payer: Self-pay | Admitting: Internal Medicine

## 2016-06-10 ENCOUNTER — Other Ambulatory Visit: Payer: Self-pay | Admitting: Allergy and Immunology

## 2016-06-11 ENCOUNTER — Other Ambulatory Visit: Payer: Self-pay

## 2016-06-11 MED ORDER — ALBUTEROL SULFATE HFA 108 (90 BASE) MCG/ACT IN AERS: INHALATION_SPRAY | RESPIRATORY_TRACT | 1 refills | Status: DC

## 2016-06-15 ENCOUNTER — Ambulatory Visit (INDEPENDENT_AMBULATORY_CARE_PROVIDER_SITE_OTHER): Payer: BLUE CROSS/BLUE SHIELD | Admitting: *Deleted

## 2016-06-15 DIAGNOSIS — J4550 Severe persistent asthma, uncomplicated: Secondary | ICD-10-CM | POA: Diagnosis not present

## 2016-06-23 ENCOUNTER — Encounter: Payer: Self-pay | Admitting: Internal Medicine

## 2016-07-13 ENCOUNTER — Ambulatory Visit (INDEPENDENT_AMBULATORY_CARE_PROVIDER_SITE_OTHER): Payer: BLUE CROSS/BLUE SHIELD | Admitting: *Deleted

## 2016-07-13 DIAGNOSIS — J4550 Severe persistent asthma, uncomplicated: Secondary | ICD-10-CM

## 2016-07-22 ENCOUNTER — Other Ambulatory Visit: Payer: Self-pay

## 2016-07-22 ENCOUNTER — Telehealth: Payer: Self-pay | Admitting: Allergy and Immunology

## 2016-07-22 MED ORDER — OSELTAMIVIR PHOSPHATE 75 MG PO CAPS: ORAL_CAPSULE | ORAL | 0 refills | Status: DC

## 2016-07-22 NOTE — Telephone Encounter (Signed)
Left message on mom's voicemail informing her of Tamiflu Rx. Rx sent to [facility] on [address].

## 2016-07-22 NOTE — Telephone Encounter (Signed)
Elizabeth BoxerYesterday [name] went to urgent care sick. She did test positive for the flu. They did not give her any medication for the flu. Mom wants to know if you will give her Tamiflu?    [facility] [address]

## 2016-07-22 NOTE — Telephone Encounter (Signed)
Please provide Katelyn Tamiflu 75 mg twice a day for 5 days and inform mom about prescription

## 2016-07-23 ENCOUNTER — Encounter (HOSPITAL_COMMUNITY): Payer: Self-pay | Admitting: *Deleted

## 2016-07-23 ENCOUNTER — Emergency Department (HOSPITAL_COMMUNITY): Payer: BLUE CROSS/BLUE SHIELD

## 2016-07-23 ENCOUNTER — Emergency Department (HOSPITAL_COMMUNITY)
Admission: EM | Admit: 2016-07-23 | Discharge: 2016-07-23 | Disposition: A | Discharge status: Home / Self Care | Payer: BLUE CROSS/BLUE SHIELD | Attending: Emergency Medicine | Admitting: Emergency Medicine

## 2016-07-23 DIAGNOSIS — Z5321 Procedure and treatment not carried out due to patient leaving prior to being seen by health care provider: Secondary | ICD-10-CM | POA: Insufficient documentation

## 2016-07-23 DIAGNOSIS — R0602 Shortness of breath: Secondary | ICD-10-CM | POA: Diagnosis present

## 2016-07-23 DIAGNOSIS — J45909 Unspecified asthma, uncomplicated: Secondary | ICD-10-CM | POA: Diagnosis not present

## 2016-07-23 LAB — CBC
HCT: 38.8 % (ref 36.0–46.0)
HEMOGLOBIN: 13.2 g/dL (ref 12.0–15.0)
MCH: 28.9 pg (ref 26.0–34.0)
MCHC: 34 g/dL (ref 30.0–36.0)
MCV: 84.9 fL (ref 78.0–100.0)
PLATELETS: 185 10*3/uL (ref 150–400)
RBC: 4.57 MIL/uL (ref 3.87–5.11)
RDW: 12.6 % (ref 11.5–15.5)
WBC: 3.6 10*3/uL — ABNORMAL LOW (ref 4.0–10.5)

## 2016-07-23 LAB — COMPREHENSIVE METABOLIC PANEL
ALBUMIN: 3.5 g/dL (ref 3.5–5.0)
ALK PHOS: 52 U/L (ref 38–126)
ALT: 34 U/L (ref 14–54)
ANION GAP: 8 (ref 5–15)
AST: 36 U/L (ref 15–41)
BUN: 7 mg/dL (ref 6–20)
CALCIUM: 9.1 mg/dL (ref 8.9–10.3)
CHLORIDE: 105 mmol/L (ref 101–111)
CO2: 23 mmol/L (ref 22–32)
Creatinine, Ser: 0.98 mg/dL (ref 0.44–1.00)
GFR calc non Af Amer: >60 mL/min (ref >60)
GLUCOSE: 102 mg/dL — AB (ref 65–99)
Potassium: 3.6 mmol/L (ref 3.5–5.1)
Sodium: 136 mmol/L (ref 135–145)
Total Bilirubin: 0.1 mg/dL — ABNORMAL LOW (ref 0.3–1.2)
Total Protein: 6.8 g/dL (ref 6.5–8.1)

## 2016-07-23 LAB — I-STAT BETA HCG BLOOD, ED (MC, WL, AP ONLY): I-stat hCG, quantitative: <5 m[IU]/mL (ref <5)

## 2016-07-23 MED ORDER — IPRATROPIUM-ALBUTEROL 0.5-2.5 (3) MG/3ML IN SOLN: 3.0000 mL | Freq: Once | RESPIRATORY_TRACT | Status: DC

## 2016-07-23 MED ORDER — IPRATROPIUM-ALBUTEROL 0.5-2.5 (3) MG/3ML IN SOLN
RESPIRATORY_TRACT | Status: AC
  Administered 2016-07-23: 3 mL
  Filled 2016-07-23: qty 3

## 2016-07-23 NOTE — ED Notes (Signed)
pts father inquiring on wait time, this RN encouraged the pt to stay for further eval, pt left without being seen after triage

## 2016-07-23 NOTE — ED Notes (Signed)
PT states no improvement after breathing treatment, even though no respiratory distress noted and sats are stable.

## 2016-07-23 NOTE — ED Triage Notes (Addendum)
Pt states was dx with flu yesterday.  Has hx of asthma and continues to be sob, despite breathing tx.  Sats 98%, rr 20.

## 2016-07-27 ENCOUNTER — Telehealth: Payer: Self-pay | Admitting: *Deleted

## 2016-07-27 MED ORDER — PREDNISONE 10 MG PO TABS: 20.0000 mg | ORAL_TABLET | Freq: Every day | ORAL | 0 refills | Status: DC

## 2016-07-27 NOTE — Addendum Note (Signed)
Addended by: Devoria GlassingVONCANNON, TAMMY M on: 07/27/2016 11:57 AM   Modules accepted: Orders

## 2016-07-27 NOTE — Telephone Encounter (Signed)
Please inform patient that we can try prednisone 10 mg 2 tablets once a day for 3 days and then one tablet once a day for 3 days. Continue all other previously prescribed medications.

## 2016-07-27 NOTE — Telephone Encounter (Addendum)
Mom advised chest tightness, using all meds and nebulizer.  Flu last week on Tamiflu since Thursday. Little cough and no wheezing. A lot of chest congestion last week but some better this week

## 2016-07-27 NOTE — Telephone Encounter (Signed)
Mom advised rx sent ro pharmacy

## 2016-07-30 ENCOUNTER — Encounter: Payer: Self-pay | Admitting: Internal Medicine

## 2016-08-10 ENCOUNTER — Ambulatory Visit: Payer: BLUE CROSS/BLUE SHIELD

## 2016-08-10 ENCOUNTER — Ambulatory Visit (INDEPENDENT_AMBULATORY_CARE_PROVIDER_SITE_OTHER): Payer: BLUE CROSS/BLUE SHIELD

## 2016-08-10 DIAGNOSIS — J4550 Severe persistent asthma, uncomplicated: Secondary | ICD-10-CM | POA: Diagnosis not present

## 2016-08-12 ENCOUNTER — Telehealth: Payer: Self-pay | Admitting: Internal Medicine

## 2016-08-12 NOTE — Telephone Encounter (Signed)
No charge. 

## 2016-08-13 ENCOUNTER — Encounter: Payer: Self-pay | Admitting: Internal Medicine

## 2016-08-30 ENCOUNTER — Encounter: Payer: Self-pay | Admitting: Internal Medicine

## 2016-08-30 ENCOUNTER — Ambulatory Visit (AMBULATORY_SURGERY_CENTER): Payer: BLUE CROSS/BLUE SHIELD | Admitting: Internal Medicine

## 2016-08-30 VITALS — BP 102/63 | HR 78 | Temp 98.6°F | Resp 54 | Ht 62.0 in | Wt 108.0 lb

## 2016-08-30 DIAGNOSIS — K219 Gastro-esophageal reflux disease without esophagitis: Secondary | ICD-10-CM

## 2016-08-30 DIAGNOSIS — R1319 Other dysphagia: Secondary | ICD-10-CM

## 2016-08-30 DIAGNOSIS — R1310 Dysphagia, unspecified: Secondary | ICD-10-CM | POA: Diagnosis not present

## 2016-08-30 HISTORY — PX: ESOPHAGOGASTRODUODENOSCOPY (EGD) WITH ESOPHAGEAL DILATION: SHX5812

## 2016-08-30 MED ORDER — SODIUM CHLORIDE 0.9 % IV SOLN: 500.0000 mL | INTRAVENOUS | Status: DC

## 2016-08-30 NOTE — Op Note (Signed)
[facility] Patient Name: Elizabeth Booker Procedure Date: 08/30/2016 10:07 AM MRN: [id number] Endoscopist: Iva Boop , MD Age: 21 Referring MD:  Date of Birth: 08/02/1995 Gender: Female Account #: [id number] Procedure:                Upper GI endoscopy Indications:              Dysphagia, Heartburn Medicines:                Propofol per Anesthesia, Monitored Anesthesia Care Procedure:                Pre-Anesthesia Assessment:                           - Prior to the procedure, a History and Physical                            was performed, and patient medications and                            allergies were reviewed. The patient's tolerance of                            previous anesthesia was also reviewed. The risks                            and benefits of the procedure and the sedation                            options and risks were discussed with the patient.                            All questions were answered, and informed consent                            was obtained. Prior Anticoagulants: The patient has                            taken no previous anticoagulant or antiplatelet                            agents. ASA Grade Assessment: II - A patient with                            mild systemic disease. After reviewing the risks                            and benefits, the patient was deemed in                            satisfactory condition to undergo the procedure.                           After obtaining informed consent, the endoscope was  passed under direct vision. Throughout the                            procedure, the patient's blood pressure, pulse, and                            oxygen saturations were monitored continuously. The                            Model GIF-HQ190 [id number]) scope was introduced                            through the mouth, and advanced to the second part                            of  duodenum. The upper GI endoscopy was                            accomplished without difficulty. The patient                            tolerated the procedure well. Scope In: Scope Out: Findings:                 One mild benign-appearing, intrinsic stenosis was                            found at the gastroesophageal junction. And was                            traversed. The scope was withdrawn. Dilation was                            performed with a Maloney dilator with no resistance                            at 54 Fr.                           A 1 cm hiatal hernia was present. Associated                            irregular z-line                           Diffuse mildly erythematous mucosa without bleeding                            was found in the cardia.                           The exam was otherwise without abnormality.                           The cardia and gastric fundus were otherwise normal  on retroflexion. Complications:            No immediate complications. Estimated Blood Loss:     Estimated blood loss: none. Impression:               - Benign-appearing esophageal stenosis. Dilated.                           - 1 cm hiatal hernia and associated irregular z-line                           - Erythematous mucosa in the cardia.                           - The examination was otherwise normal. No                            endoscopic signs of eosinophilic esophagitis                           - No specimens collected. Recommendation:           - Patient has a contact number available for                            emergencies. The signs and symptoms of potential                            delayed complications were discussed with the                            patient. Return to normal activities tomorrow.                            Written discharge instructions were provided to the                            patient.                            - Clear liquids x 1 hour then soft foods rest of                            day. Start prior diet tomorrow.                           - Continue present medications.                           - Return to my office in 6 weeks. Iva Booparl E Kambree Krauss, MD 08/30/2016 10:31:17 AM This report has been signed electronically.

## 2016-08-30 NOTE — Progress Notes (Signed)
A/ox3, pleased with MAC, report to RN 

## 2016-08-30 NOTE — Patient Instructions (Addendum)
   I saw a small hiatal hernia and slight narrowing where the esophagus and stomach meet. In the stomach there were some red areas probably related to the vomiting you had earlier this month but it is not a sign of a problem.  I dilated the esophagus so let us see how you do with swallowing after that. Please make an appointment to see me in early April - call soon and set that up.  I appreciate the opportunity to care for you. Iva Booparl E. Obie Kallenbach, MD, FACG  YOU HAD AN ENDOSCOPIC PROCEDURE TODAY AT THE [facility]:   Refer to the procedure report that was given to you for any specific questions about what was found during the examination.  If the procedure report does not answer your questions, please call your gastroenterologist to clarify.  If you requested that your care partner not be given the details of your procedure findings, then the procedure report has been included in a sealed envelope for you to review at your convenience later.  YOU SHOULD EXPECT: Some feelings of bloating in the abdomen. Passage of more gas than usual.  Walking can help get rid of the air that was put into your GI tract during the procedure and reduce the bloating. I  Please Note:  You might notice some irritation and congestion in your nose or some drainage.  This is from the oxygen used during your procedure.  There is no need for concern and it should clear up in a day or so.  SYMPTOMS TO REPORT IMMEDIATELY:   Following upper endoscopy (EGD)  Vomiting of blood or coffee ground material  New chest pain or pain under the shoulder blades  Painful or persistently difficult swallowing  New shortness of breath  Fever of 100F or higher  Black, tarry-looking stools  For urgent or emergent issues, a gastroenterologist can be reached at any hour by calling [phone].   DIET:  Clear liquids for 1 hour, then soft foods the rest of today.  Drink plenty of fluids but you should avoid alcoholic  beverages for 24 hours.  ACTIVITY:  You should plan to take it easy for the rest of today and you should NOT DRIVE or use heavy machinery until tomorrow (because of the sedation medicines used during the test).    FOLLOW UP: Our staff will call the number listed on your records the next business day following your procedure to check on you and address any questions or concerns that you may have regarding the information given to you following your procedure. If we do not reach you, we will leave a message.  However, if you are feeling well and you are not experiencing any problems, there is no need to return our call.  We will assume that you have returned to your regular daily activities without incident.  SIGNATURES/CONFIDENTIALITY: You and/or your care partner have signed paperwork which will be entered into your electronic medical record.  These signatures attest to the fact that that the information above on your After Visit Summary has been reviewed and is understood.  Full responsibility of the confidentiality of this discharge information lies with you and/or your care-partner.  Please read over handout about esophageal stricture  Follow diet for today  Please call Dr. Marvell FullerGessner's office to set up follow up appointment for today

## 2016-08-30 NOTE — Progress Notes (Signed)
Called to room to assist during endoscopic procedure.  Patient ID and intended procedure confirmed with present staff. Received instructions for my participation in the procedure from the performing physician.  

## 2016-08-31 ENCOUNTER — Telehealth: Payer: Self-pay

## 2016-08-31 NOTE — Telephone Encounter (Signed)
  Follow up Call-  Call back number 08/30/2016  Post procedure Call Back phone  # [phone]  Permission to leave phone message Yes  Some recent data might be hidden     Patient questions:  Do you have a fever, pain , or abdominal swelling? No. Pain Score  0 *  Have you tolerated food without any problems? Yes.    Have you been able to return to your normal activities? Yes.    Do you have any questions about your discharge instructions: Diet   No. Medications  No. Follow up visit  No.  Do you have questions or concerns about your Care? No.  Actions: * If pain score is 4 or above: No action needed, pain <4.  Patient unavailable. Spoke with patient's mother who relayed that patient is eating without difficulty and otherwise doing well and does not any problems or questions at this time.

## 2016-09-07 ENCOUNTER — Ambulatory Visit: Payer: Self-pay

## 2016-09-08 ENCOUNTER — Ambulatory Visit (INDEPENDENT_AMBULATORY_CARE_PROVIDER_SITE_OTHER): Payer: BLUE CROSS/BLUE SHIELD

## 2016-09-08 DIAGNOSIS — J4550 Severe persistent asthma, uncomplicated: Secondary | ICD-10-CM

## 2016-09-28 ENCOUNTER — Ambulatory Visit (INDEPENDENT_AMBULATORY_CARE_PROVIDER_SITE_OTHER): Payer: BLUE CROSS/BLUE SHIELD | Admitting: Internal Medicine

## 2016-09-28 ENCOUNTER — Encounter: Payer: Self-pay | Admitting: Internal Medicine

## 2016-09-28 VITALS — BP 116/68 | HR 80 | Resp 16 | Ht 62.0 in | Wt 114.0 lb

## 2016-09-28 DIAGNOSIS — R1310 Dysphagia, unspecified: Secondary | ICD-10-CM

## 2016-09-28 DIAGNOSIS — R198 Other specified symptoms and signs involving the digestive system and abdomen: Secondary | ICD-10-CM

## 2016-09-28 DIAGNOSIS — K219 Gastro-esophageal reflux disease without esophagitis: Secondary | ICD-10-CM | POA: Diagnosis not present

## 2016-09-28 DIAGNOSIS — F458 Other somatoform disorders: Secondary | ICD-10-CM | POA: Diagnosis not present

## 2016-09-28 DIAGNOSIS — R0989 Other specified symptoms and signs involving the circulatory and respiratory systems: Secondary | ICD-10-CM

## 2016-09-28 NOTE — Progress Notes (Signed)
Elizabeth Booker 20 y.o. Dec 01, 1995 [id number]  Assessment & Plan:   1. Globus sensation   2. Dysphagia, unspecified type   3. GERD without esophagitis    She is status post upper GI endoscopy with Maloney dilation of the esophagus. It sounds like she could be better but she still has significant symptoms of globus and possible dysphagia. Maybe there is a motility disturbance. To workup GI causes further I think an esophageal manometry and 24-hour pH and impedance study are appropriate. She is willing to do this, I've explained the procedure to her. I would do these while she is on her Dexilant and ranitidine. We did explore anxiety as a possible cause perhaps not, I've seen a fair amount of globus sensation associated with that, I don't know if there is a relationship to her asthma though I suppose there could be, she could be having some sort of motility disturbance of the esophagus that has not yet been appreciated.  She has about 6 weeks of college left and anticipates scheduling her procedures after that. CC: Norman Clay, MD I will send a copy to Dr. Laurette Schimke  Subjective:   Chief Complaint: Reflux  HPI The patient is here with her parents. I had seen her at the request of Dr. Lucie Leather because of dysphagia and heartburn issues. She is maintained on DEXI LANT and ranitidine. She think she is somewhat better but she is still having issues were she's nauseous, she has a globus sensation and may be some esophageal type dysphagia. Her endoscopy did not show any major problems there was a mild distal esophageal stenosis. [place] was on February 19. A 54 French Maloney dilator was passed she does not think there was any significant change in her symptoms after that though overall perhaps a little bit better still quite symptomatic. Asthma seems improved. Her parents are related winter time is often a worse problem and some asthma and other issues. She is busy in school at this point. Denies any  significant postnasal drip or sinus drainage. Current Meds  Medication Sig  . albuterol (PROAIR HFA) 108 (90 Base) MCG/ACT inhaler Use 2 puffs every four hours as needed for cough or wheeze.  May use 2 puffs 10-20 minutes prior to exercise.  Marland Kitchen dexlansoprazole (DEXILANT) 60 MG capsule Take 1 capsule (60 mg total) by mouth daily.  . DULERA 200-5 MCG/ACT AERO INHALE TWO PUFFS BY MOUTH TWICE DAILY TO PREVENT COUGH OR WHEEZE. RINSE, GARGLE AND SPIT AFTER USE  . EPINEPHrine 0.3 mg/0.3 mL IJ SOAJ injection Inject 0.3 mLs (0.3 mg total) into the muscle once.  . fluticasone (FLONASE) 50 MCG/ACT nasal spray Place into both nostrils daily.  Marland Kitchen ibuprofen (ADVIL,MOTRIN) 200 MG tablet Take 400 mg by mouth every 6 (six) hours as needed. For pain  . ipratropium-albuterol (DUONEB) 0.5-2.5 (3) MG/3ML SOLN Take 3 mLs by nebulization every 6 (six) hours as needed.  Marland Kitchen levocetirizine (XYZAL) 5 MG tablet Take 5 mg by mouth every evening.  . Mepolizumab (NUCALA) 100 MG SOLR Inject 100 mg into the skin every 28 (twenty-eight) days.  . montelukast (SINGULAIR) 10 MG tablet Take one each evening to prevent cough or wheeze.  Marland Kitchen NUVARING 0.12-0.015 MG/24HR vaginal ring   . Olopatadine HCl (PATANOL OP) Apply to eye. Uses PRN  . QVAR 80 MCG/ACT inhaler INHALE TWO PUFFS BY MOUTH TWICE DAILY DURING FLARE-UP AS DIRECTED. RINSE, GARGLE AND SPIT AFTER USE  . ranitidine (ZANTAC) 300 MG tablet Take 1 tablet (300 mg  total) by mouth at bedtime.   Current Facility-Administered Medications for the 09/28/16 encounter (Office Visit) with Iva Booparl E Gessner, MD  Medication  . Mepolizumab SOLR 100 mg   Allergies  Allergen Reactions  . Cucumber Extract Anaphylaxis  . Mango Flavor Anaphylaxis    fruit  . Onion Anaphylaxis    raw  . Other Anaphylaxis    Cantaloupe, most vegetables and fruits  . Watermelon Concentrate [Citrullus Vulgaris] Anaphylaxis   Past Medical History:  Diagnosis Date  . Allergy   . Asthma   . GERD  (gastroesophageal reflux disease)   . Kidney infection    Past Surgical History:  Procedure Laterality Date  . ESOPHAGOGASTRODUODENOSCOPY (EGD) WITH ESOPHAGEAL DILATION  08/30/2016  . MOUTH SURGERY      Review of Systems As above denies significant anxiety issues.  Objective:   Physical Exam BP 116/68   Pulse 80   Resp 16   Ht 5\' 2"  (1.575 m)   Wt 51.7 kg (114 lb)   BMI 20.85 kg/m  She is in no acute distress, quiet Eyes anicteric Neck supple nontender trachea midline no masses or thyromegaly no lymphadenopathy  15 minutes time spent with patient > half in counseling coordination of care

## 2016-09-28 NOTE — Patient Instructions (Addendum)
You have been scheduled for an esophageal manometry at [facility] on ___________ at _____________. Please arrive 30 minutes prior to your procedure for registration. You will need to go to outpatient registration (1st floor of the hospital) first. Make certain to bring your insurance cards as well as a complete list of medications.  Please remember the following:  1) Do not take any muscle relaxants, xanax (alprazolam) or ativan for 1 day prior to your test as well as the day of the test.  2) Nothing to eat or drink after 12:00 midnight on the night before your test.  3) Hold all diabetic medications/insulin the morning of the test. You may eat and take  your medications after the test.  It will take at least 2 weeks to receive the results of this test from your physician. ------------------------------------------ ABOUT ESOPHAGEAL MANOMETRY Esophageal manometry (muh-NOM-uh-tree) is a test that gauges how well your esophagus works. Your esophagus is the long, muscular tube that connects your throat to your stomach. Esophageal manometry measures the rhythmic muscle contractions (peristalsis) that occur in your esophagus when you swallow. Esophageal manometry also measures the coordination and force exerted by the muscles of your esophagus.  During esophageal manometry, a thin, flexible tube (catheter) that contains sensors is passed through your nose, down your esophagus and into your stomach. Esophageal manometry can be helpful in diagnosing some mostly uncommon disorders that affect your esophagus.  Why it's done Esophageal manometry is used to evaluate the movement (motility) of food through the esophagus and into the stomach. The test measures how well the circular bands of muscle (sphincters) at the top and bottom of your esophagus open and close, as well as the pressure, strength and pattern of the wave of esophageal muscle contractions that moves food along.  What you can  expect Esophageal manometry is an outpatient procedure done without sedation. Most people tolerate it well. You may be asked to change into a hospital gown before the test starts.  During esophageal manometry  While you are sitting up, a member of your health care team sprays your throat with a numbing medication or puts numbing gel in your nose or both.  A catheter is guided through your nose into your esophagus. The catheter may be sheathed in a water-filled sleeve. It doesn't interfere with your breathing. However, your eyes may water, and you may gag. You may have a slight nosebleed from irritation.  After the catheter is in place, you may be asked to lie on your back on an exam table, or you may be asked to remain seated.  You then swallow small sips of water. As you do, a computer connected to the catheter records the pressure, strength and pattern of your esophageal muscle contractions.  During the test, you'll be asked to breathe slowly and smoothly, remain as still as possible, and swallow only when you're asked to do so.  A member of your health care team may move the catheter down into your stomach while the catheter continues its measurements.  The catheter then is slowly withdrawn. The test usually lasts 20 to 30 minutes.  After esophageal manometry  When your esophageal manometry is complete, you may return to your normal activities  This test typically takes 30-45 minutes to complete. ________________________________________________________________________________   Do it on your medicines per Dr Leone PayorGessner, your acid blocking medicines.    I appreciate the opportunity to care for you. Stan Headarl Gessner, MD, [facility]

## 2016-09-29 ENCOUNTER — Other Ambulatory Visit: Payer: Self-pay | Admitting: Internal Medicine

## 2016-09-29 ENCOUNTER — Telehealth: Payer: Self-pay

## 2016-09-29 DIAGNOSIS — R0989 Other specified symptoms and signs involving the circulatory and respiratory systems: Secondary | ICD-10-CM

## 2016-09-29 DIAGNOSIS — K219 Gastro-esophageal reflux disease without esophagitis: Secondary | ICD-10-CM

## 2016-09-29 DIAGNOSIS — R1310 Dysphagia, unspecified: Secondary | ICD-10-CM

## 2016-09-29 DIAGNOSIS — R198 Other specified symptoms and signs involving the digestive system and abdomen: Secondary | ICD-10-CM

## 2016-09-29 DIAGNOSIS — R09A2 Foreign body sensation, throat: Secondary | ICD-10-CM

## 2016-09-29 NOTE — Telephone Encounter (Signed)
Spoke with mom Luster LandsbergRenee and informed her of the [facility] date for testing to be done: 11/24/16 at 10:30AM, arrive at 10:00AM. She was given the # to billing ([phone]) to call about cost of the esophageal manometry and 24 hour ph impedence study.

## 2016-10-06 ENCOUNTER — Ambulatory Visit (INDEPENDENT_AMBULATORY_CARE_PROVIDER_SITE_OTHER): Payer: BLUE CROSS/BLUE SHIELD

## 2016-10-06 DIAGNOSIS — J4550 Severe persistent asthma, uncomplicated: Secondary | ICD-10-CM

## 2016-10-07 ENCOUNTER — Other Ambulatory Visit: Payer: Self-pay | Admitting: Allergy and Immunology

## 2016-10-15 ENCOUNTER — Other Ambulatory Visit: Payer: Self-pay | Admitting: Allergy and Immunology

## 2016-10-15 MED ORDER — RANITIDINE HCL 300 MG PO TABS: 300.0000 mg | ORAL_TABLET | Freq: Every day | ORAL | 0 refills | Status: DC

## 2016-10-15 NOTE — Telephone Encounter (Signed)
patient needs a refill on RANITIDINE called into [facility] on [address]

## 2016-10-15 NOTE — Telephone Encounter (Signed)
Ranitidine sent to pharmacy

## 2016-11-03 ENCOUNTER — Other Ambulatory Visit: Payer: Self-pay | Admitting: Allergy and Immunology

## 2016-11-04 ENCOUNTER — Ambulatory Visit (INDEPENDENT_AMBULATORY_CARE_PROVIDER_SITE_OTHER): Payer: BLUE CROSS/BLUE SHIELD | Admitting: *Deleted

## 2016-11-04 DIAGNOSIS — J4550 Severe persistent asthma, uncomplicated: Secondary | ICD-10-CM | POA: Diagnosis not present

## 2016-11-11 ENCOUNTER — Other Ambulatory Visit: Payer: Self-pay | Admitting: Allergy and Immunology

## 2016-11-11 MED ORDER — RANITIDINE HCL 300 MG PO TABS: 300.0000 mg | ORAL_TABLET | Freq: Every day | ORAL | 0 refills | Status: DC

## 2016-11-11 MED ORDER — DEXLANSOPRAZOLE 60 MG PO CPDR: 60.0000 mg | DELAYED_RELEASE_CAPSULE | Freq: Every day | ORAL | 0 refills | Status: DC

## 2016-11-11 MED ORDER — ALBUTEROL SULFATE HFA 108 (90 BASE) MCG/ACT IN AERS: INHALATION_SPRAY | RESPIRATORY_TRACT | 0 refills | Status: DC

## 2016-11-11 NOTE — Telephone Encounter (Signed)
patient needs refill on PROAIR, DEXILANT, and OTC reflux meds [facility] [address]

## 2016-11-11 NOTE — Telephone Encounter (Signed)
I sent in 1 refill for Proair, Dexilant, and Zantac with no refills [facility] Patient must come to appointment May 29th in order to get further refills.

## 2016-11-11 NOTE — Telephone Encounter (Signed)
Called patient. spoke to mom and informed her that we were unable to send any medication at this time. Patient needed an office visit. Patient made an office visit for May 29 at 10:15. I informed mom that we could send it when she comes to her visit.

## 2016-11-23 ENCOUNTER — Telehealth: Payer: Self-pay | Admitting: Internal Medicine

## 2016-11-23 NOTE — Telephone Encounter (Signed)
Left message for patient to call back  

## 2016-11-23 NOTE — Telephone Encounter (Signed)
[facility].

## 2016-11-24 NOTE — Progress Notes (Signed)
Spoke to patients father regarding manometry.  Pt father stated that the patient took her acid reflux medication this am after being educated not to.  Pt. Asked to reschedule procedure, after RN reviewed previous notes.    Omelia BlackwaterShelby Sumie Remsen, RN

## 2016-11-24 NOTE — Telephone Encounter (Signed)
Patient mother calling back again stating pt has something to do this day and wants to resch again.

## 2016-11-24 NOTE — Telephone Encounter (Signed)
Patient has been rescheduled with her mom for 12/15/16 10:30

## 2016-12-01 ENCOUNTER — Ambulatory Visit: Payer: BLUE CROSS/BLUE SHIELD

## 2016-12-07 ENCOUNTER — Encounter: Payer: Self-pay | Admitting: Allergy and Immunology

## 2016-12-07 ENCOUNTER — Ambulatory Visit (INDEPENDENT_AMBULATORY_CARE_PROVIDER_SITE_OTHER): Payer: BLUE CROSS/BLUE SHIELD | Admitting: Allergy and Immunology

## 2016-12-07 VITALS — BP 110/68 | HR 76 | Resp 16

## 2016-12-07 DIAGNOSIS — J4550 Severe persistent asthma, uncomplicated: Secondary | ICD-10-CM | POA: Diagnosis not present

## 2016-12-07 DIAGNOSIS — J3089 Other allergic rhinitis: Secondary | ICD-10-CM | POA: Diagnosis not present

## 2016-12-07 DIAGNOSIS — Z91018 Allergy to other foods: Secondary | ICD-10-CM | POA: Diagnosis not present

## 2016-12-07 DIAGNOSIS — K219 Gastro-esophageal reflux disease without esophagitis: Secondary | ICD-10-CM

## 2016-12-07 MED ORDER — MOMETASONE FURO-FORMOTEROL FUM 200-5 MCG/ACT IN AERO: 2.0000 | INHALATION_SPRAY | Freq: Two times a day (BID) | RESPIRATORY_TRACT | 4 refills | Status: DC

## 2016-12-07 MED ORDER — DEXLANSOPRAZOLE 60 MG PO CPDR: 60.0000 mg | DELAYED_RELEASE_CAPSULE | Freq: Every day | ORAL | 5 refills | Status: DC

## 2016-12-07 MED ORDER — RANITIDINE HCL 300 MG PO TABS: 300.0000 mg | ORAL_TABLET | Freq: Every day | ORAL | 5 refills | Status: DC

## 2016-12-07 MED ORDER — ALBUTEROL SULFATE HFA 108 (90 BASE) MCG/ACT IN AERS: INHALATION_SPRAY | RESPIRATORY_TRACT | 0 refills | Status: DC

## 2016-12-07 MED ORDER — MONTELUKAST SODIUM 10 MG PO TABS: 10.0000 mg | ORAL_TABLET | Freq: Every day | ORAL | 5 refills | Status: DC

## 2016-12-07 MED ORDER — FLUTICASONE PROPIONATE 50 MCG/ACT NA SUSP: 2.0000 | NASAL | 5 refills | Status: DC | PRN

## 2016-12-07 NOTE — Patient Instructions (Addendum)
  1. Treat reflux:   A. continue to work on Eliminating caffeine and chocolate  B. continue Dexilant 60 mg one tablet in a.m.  C. Ranitidine 300mg  one tablet in PM  2. Can use DuoNeb nebulization at home every 4-6 hours if needed.    3. Continue Dulera 200 - 2 inhalations twice a day  4. Add in Qvar 80 Redihaler - 2 inhalations twice a day during "flareup"  5. Continue nasal fluticasone 1-2 sprays each nostril daily  6. Continue montelukast 10 mg daily   7. Continue antihistamine, ibuprofen, Mucinex DM if needed  8. Continue immunotherapy and EpiPen  9. Continue Nucala injections  10. Return to clinic in 6 months or earlier if problem

## 2016-12-07 NOTE — Progress Notes (Signed)
This encounter was created in error - please disregard.

## 2016-12-07 NOTE — Progress Notes (Signed)
Follow-up Note  Referring Provider: Loyola MastLowe, Melissa, MD Primary Provider: Loyola MastLowe, Melissa, MD Date of Office Visit: 12/07/2016  Subjective:   Elizabeth Booker (DOB: 10-23-1995) is a 21 y.o. female [facility] in re-evaluation of the following:  HPI: Elizabeth FelixKatelyn returns to this clinic in reevaluation of her asthma and allergic rhinitis and history of food allergy and history of reflux. I last saw her in this clinic November 2017.  This winter she did contract influenza requiring the administration of Tamiflu and prednisone. Otherwise, she has done very well with her asthma and has not required any additional steroids or antibiotics to treat any type of respiratory tract issue. Her requirement for short acting bronchodilator is less than twice a week. She continues to use Dulera every day and continues on nucala injections.  She's had very little issues with her nose.  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  She is seeing Dr. Leone PayorGessner concerning her reflux and is scheduled to have a manometry and 24-hour esophageal pH meter test for what sounds like possible esophageal dysmotility and globus sensation.  Allergies as of 12/07/2016      Reactions   Cucumber Extract Anaphylaxis   Mango Flavor Anaphylaxis   fruit   Onion Anaphylaxis   raw   Other Anaphylaxis   Cantaloupe, most vegetables and fruits   Watermelon Concentrate [citrullus Vulgaris] Anaphylaxis      Medication List      albuterol 108 (90 Base) MCG/ACT inhaler Commonly known as:  PROAIR HFA INHALE TWO PUFFS BY MOUTH EVERY 4 HOURS AS NEEDED FOR COUGH FOR WHEEZING. MAY USE 2 PUFFS 10-20 MINUTES PRIOR TO EXERCISE.   dexlansoprazole 60 MG capsule Commonly known as:  DEXILANT Take 1 capsule (60 mg total) by mouth daily.     EPINEPHrine 0.3 mg/0.3 mL Soaj injection Commonly known as:  EPI-PEN Inject 0.3 mLs (0.3 mg total) into the muscle once.   fluticasone 50 MCG/ACT nasal spray Commonly known as:  FLONASE Place 2 sprays into both nostrils as needed.   ibuprofen 200 MG tablet Commonly known as:  ADVIL,MOTRIN Take 400 mg by mouth every 6 (six) hours as needed. For pain   ipratropium-albuterol 0.5-2.5 (3) MG/3ML Soln Commonly known as:  DUONEB Take 3 mLs by nebulization every 6 (six) hours as needed.   levocetirizine 5 MG tablet Commonly known as:  XYZAL Take 5 mg by mouth every evening.   Mepolizumab 100 MG Solr Commonly known as:  NUCALA Inject 100 mg into the skin every 28 (twenty-eight) days.   mometasone-formoterol 200-5 MCG/ACT Aero Commonly known as:  DULERA Inhale 2 puffs into the lungs 2 (two) times daily.   montelukast 10 MG tablet Commonly known as:  SINGULAIR Take 1 tablet (10 mg total) by mouth at bedtime.   NUVARING 0.12-0.015 MG/24HR vaginal ring Generic drug:  etonogestrel-ethinyl estradiol   PATANOL OP Apply to eye. Uses PRN   QVAR 80 MCG/ACT inhaler Generic drug:  beclomethasone INHALE TWO PUFFS BY MOUTH TWICE DAILY DURING FLARE-UP AS DIRECTED. RINSE, GARGLE AND SPIT AFTER USE   ranitidine 300 MG tablet Commonly known as:  ZANTAC Take 1 tablet (300 mg total) by mouth at bedtime.       Past Medical History:  Diagnosis Date  . Allergy   . Asthma   .  GERD (gastroesophageal reflux disease)   . Kidney infection     Past Surgical History:  Procedure Laterality Date  . ESOPHAGOGASTRODUODENOSCOPY (EGD) WITH ESOPHAGEAL DILATION  08/30/2016  . MOUTH SURGERY      Review of systems negative except as noted in HPI / PMHx or noted below:  Review of Systems  Constitutional: Negative.   HENT: Negative.   Eyes: Negative.   Respiratory: Negative.   Cardiovascular: Negative.   Gastrointestinal: Negative.   Genitourinary: Negative.   Musculoskeletal: Negative.    Skin: Negative.   Neurological: Negative.   Endo/Heme/Allergies: Negative.   Psychiatric/Behavioral: Negative.      Objective:   Vitals:   12/07/16 1035  BP: 110/68  Pulse: 76  Resp: 16          Physical Exam  Constitutional: She is well-developed, well-nourished, and in no distress.  HENT:  Head: Normocephalic.  Right Ear: Tympanic membrane, external ear and ear canal normal.  Left Ear: Tympanic membrane, external ear and ear canal normal.  Nose: Nose normal. No mucosal edema or rhinorrhea.  Mouth/Throat: Uvula is midline, oropharynx is clear and moist and mucous membranes are normal. No oropharyngeal exudate.  Eyes: Conjunctivae are normal.  Neck: Trachea normal. No tracheal tenderness present. No tracheal deviation present. No thyromegaly present.  Cardiovascular: Normal rate, regular rhythm, S1 normal, S2 normal and normal heart sounds.   No murmur heard. Pulmonary/Chest: Breath sounds normal. No stridor. No respiratory distress. She has no wheezes. She has no rales.  Musculoskeletal: She exhibits no edema.  Lymphadenopathy:       Head (right side): No tonsillar adenopathy present.       Head (left side): No tonsillar adenopathy present.    She has no cervical adenopathy.  Neurological: She is alert. Gait normal.  Skin: No rash noted. She is not diaphoretic. No erythema. Nails show no clubbing.  Psychiatric: Mood and affect normal.    Diagnostics:    Spirometry was performed and demonstrated an FEV1 of 3.02 at 99 % of predicted.  The patient had an Asthma Control Test with the following results: ACT Total Score: 20.    Assessment and Plan:   1. Severe persistent asthma, uncomplicated   2. Other allergic rhinitis   3. Gastroesophageal reflux disease, esophagitis presence not specified   4. Food allergy      1. Treat reflux:   A. continue to work on Eliminating caffeine and chocolate  B. continue Dexilant 60 mg one tablet in a.m.  C. Ranitidine 300mg  one  tablet in PM  2. Can use DuoNeb nebulization at home every 4-6 hours if needed.    3. Continue Dulera 200 - 2 inhalations twice a day  4. Add in Qvar 80 Redihaler - 2 inhalations twice a day during "flareup"  5. Continue nasal fluticasone 1-2 sprays each nostril daily  6. Continue montelukast 10 mg daily   7. Continue antihistamine, ibuprofen, Mucinex DM if needed  8. Continue immunotherapy and EpiPen  9. Continue Nucala injections  10. Return to clinic in 6 months or earlier if problem  Elizabeth Booker appears to be doing relatively well regarding her atopic respiratory disease and she will continue to use the therapy noted above and I will see her back in this clinic in approximately 6 months or earlier if there is a problem.  Laurette Schimke, MD Allergy / Immunology [facility]

## 2016-12-15 ENCOUNTER — Ambulatory Visit (HOSPITAL_COMMUNITY)
Admission: RE | Admit: 2016-12-15 | Discharge: 2016-12-15 | Disposition: A | Discharge status: Home / Self Care | Payer: BLUE CROSS/BLUE SHIELD | Source: Ambulatory Visit | Attending: Internal Medicine | Admitting: Internal Medicine

## 2016-12-15 ENCOUNTER — Encounter (HOSPITAL_COMMUNITY)
Admission: RE | Disposition: A | Discharge status: Home / Self Care | Payer: Self-pay | Source: Ambulatory Visit | Attending: Internal Medicine

## 2016-12-15 DIAGNOSIS — R12 Heartburn: Secondary | ICD-10-CM

## 2016-12-15 HISTORY — PX: ESOPHAGEAL MANOMETRY: SHX5429

## 2016-12-15 HISTORY — PX: PH IMPEDANCE STUDY: SHX5565

## 2016-12-15 SURGERY — MANOMETRY, ESOPHAGUS

## 2016-12-15 MED ORDER — LIDOCAINE VISCOUS 2 % MT SOLN
OROMUCOSAL | Status: AC
  Filled 2016-12-15: qty 15

## 2016-12-15 SURGICAL SUPPLY — 2 items
FACESHIELD LNG OPTICON STERILE (SAFETY) IMPLANT
GLOVE BIO SURGEON STRL SZ8 (GLOVE) ×4 IMPLANT

## 2016-12-15 NOTE — Progress Notes (Signed)
Esophageal manometry performed per protocol.  Patient tolerated well.  24 hour pH probe then placed per protocol.  Patient instructed on use of device and manual log.  Patient also instructed to return on 12/16/16 at 1135 to have probe removed.  Procedure tolerated well.

## 2016-12-17 ENCOUNTER — Encounter (HOSPITAL_COMMUNITY): Payer: Self-pay | Admitting: Internal Medicine

## 2016-12-21 DIAGNOSIS — R12 Heartburn: Secondary | ICD-10-CM

## 2016-12-22 ENCOUNTER — Telehealth: Payer: Self-pay | Admitting: Internal Medicine

## 2016-12-23 NOTE — Telephone Encounter (Signed)
The tests are normal - no problems seen. S  What are her current esophageal sxs?

## 2016-12-23 NOTE — Telephone Encounter (Signed)
Patient's wife notified of results.  Mom is not sure of her current symptoms with the exception of the GERD.  She will have her call when she returns home from out of town with an update

## 2016-12-23 NOTE — Telephone Encounter (Signed)
Dr. Leone PayorGessner,  Have you seen the report.  There is one scanned in , but it doesn't look complete

## 2017-01-04 ENCOUNTER — Ambulatory Visit (INDEPENDENT_AMBULATORY_CARE_PROVIDER_SITE_OTHER): Payer: BLUE CROSS/BLUE SHIELD | Admitting: *Deleted

## 2017-01-04 DIAGNOSIS — J4550 Severe persistent asthma, uncomplicated: Secondary | ICD-10-CM

## 2017-01-14 ENCOUNTER — Ambulatory Visit: Payer: BLUE CROSS/BLUE SHIELD | Admitting: Allergy

## 2017-01-18 ENCOUNTER — Encounter: Payer: Self-pay | Admitting: Allergy and Immunology

## 2017-01-18 ENCOUNTER — Ambulatory Visit (INDEPENDENT_AMBULATORY_CARE_PROVIDER_SITE_OTHER): Payer: BLUE CROSS/BLUE SHIELD | Admitting: Allergy and Immunology

## 2017-01-18 VITALS — BP 118/70 | HR 84 | Resp 16

## 2017-01-18 DIAGNOSIS — J3089 Other allergic rhinitis: Secondary | ICD-10-CM

## 2017-01-18 DIAGNOSIS — K219 Gastro-esophageal reflux disease without esophagitis: Secondary | ICD-10-CM | POA: Diagnosis not present

## 2017-01-18 DIAGNOSIS — Z91018 Allergy to other foods: Secondary | ICD-10-CM

## 2017-01-18 DIAGNOSIS — J4550 Severe persistent asthma, uncomplicated: Secondary | ICD-10-CM | POA: Diagnosis not present

## 2017-01-18 NOTE — Progress Notes (Signed)
Follow-up Note  Referring Provider: Loyola Mast, MD Primary Provider: Loyola Mast, MD Date of Office Visit: 01/18/2017  Subjective:   Elizabeth Booker (DOB: 01/22/1996) is a 21 y.o. female who [facility] 01[facility]2017 in re-evaluation of the following:  HPI: Konrad Felix returns to this clinic in evaluation of her asthma treated with mepolizumab, allergic rhinitis, food allergy, and reflux. I last saw her in this clinic 12/07/2016 at which point in time she was doing very well regarding each one of these issues and she had instructions to use a plan to address each issue and return to this clinic in 6 months.  Unfortunately, it appears as though a viral upper respiratory tract infection ran through her household initially infecting her mother and then her father and subsequently Fatima this past Tuesday with sore throat and runny nose and just feeling bad in general and by Friday had coughing and wheezing for which she went to the [facility] and was treated with amoxicillin. She is much better at this point in time regarding both her head and chest and she does not have any fever at this point. She did activate her action plan with the introduction of Qvar to her Dulera at the onset of this issue.  Allergies as of 01/18/2017      Reactions   Cucumber Extract Anaphylaxis   Mango Flavor Anaphylaxis   fruit   Onion Anaphylaxis   raw   Other Anaphylaxis   Cantaloupe, most vegetables and fruits   Watermelon Concentrate [citrullus Vulgaris] Anaphylaxis      Medication List      albuterol 108 (90 Base) MCG/ACT inhaler Commonly known as:  PROAIR HFA INHALE TWO PUFFS BY MOUTH EVERY 4 HOURS AS NEEDED FOR COUGH FOR WHEEZING. MAY USE 2 PUFFS 10-20 MINUTES PRIOR TO EXERCISE.   dexlansoprazole 60 MG capsule Commonly known as:  DEXILANT Take 1 capsule (60 mg total) by mouth daily.   EPINEPHrine 0.3 mg/0.3 mL Soaj injection Commonly known as:   EPI-PEN Inject 0.3 mLs (0.3 mg total) into the muscle once.   fluticasone 50 MCG/ACT nasal spray Commonly known as:  FLONASE Place 2 sprays into both nostrils as needed.   ibuprofen 200 MG tablet Commonly known as:  ADVIL,MOTRIN Take 400 mg by mouth every 6 (six) hours as needed. For pain   ipratropium-albuterol 0.5-2.5 (3) MG/3ML Soln Commonly known as:  DUONEB Take 3 mLs by nebulization every 6 (six) hours as needed.   levocetirizine 5 MG tablet Commonly known as:  XYZAL Take 5 mg by mouth every evening.   Mepolizumab 100 MG Solr Commonly known as:  NUCALA Inject 100 mg into the skin every 28 (twenty-eight) days.   mometasone-formoterol 200-5 MCG/ACT Aero Commonly known as:  DULERA Inhale 2 puffs into the lungs 2 (two) times daily.   montelukast 10 MG tablet Commonly known as:  SINGULAIR Take 1 tablet (10 mg total) by mouth at bedtime.   NUVARING 0.12-0.015 MG/24HR vaginal ring Generic drug:  etonogestrel-ethinyl estradiol   PATANOL OP Apply to eye. Uses PRN   QVAR 80 MCG/ACT inhaler Generic drug:  beclomethasone INHALE TWO PUFFS BY MOUTH TWICE DAILY DURING FLARE-UP AS DIRECTED. RINSE, GARGLE AND SPIT AFTER USE   ranitidine 300 MG tablet Commonly known as:  ZANTAC Take 1 tablet (300 mg total) by mouth at bedtime.       Past Medical History:  Diagnosis Date  . Allergy   . Asthma   .  GERD (gastroesophageal reflux disease)   . Kidney infection     Past Surgical History:  Procedure Laterality Date  . ESOPHAGEAL MANOMETRY N/A 12/15/2016   Procedure: ESOPHAGEAL MANOMETRY (EM);  Surgeon: Iva Boop, MD;  Location: [facility];  Service: Endoscopy;  Laterality: N/A;  . ESOPHAGOGASTRODUODENOSCOPY (EGD) WITH ESOPHAGEAL DILATION  08/30/2016  . MOUTH SURGERY    . PH IMPEDANCE STUDY N/A 12/15/2016   Procedure: PH IMPEDANCE STUDY;  Surgeon: Iva Boop, MD;  Location: [facility];  Service: Endoscopy;  Laterality: N/A;    Review of systems negative except  as noted in HPI / PMHx or noted below:  Review of Systems  Constitutional: Negative.   HENT: Negative.   Eyes: Negative.   Respiratory: Negative.   Cardiovascular: Negative.   Gastrointestinal: Negative.   Genitourinary: Negative.   Musculoskeletal: Negative.   Skin: Negative.   Neurological: Negative.   Endo/Heme/Allergies: Negative.   Psychiatric/Behavioral: Negative.      Objective:   Vitals:   01/18/17 1036  BP: 118/70  Pulse: 84  Resp: 16          Physical Exam  Constitutional: She is well-developed, well-nourished, and in no distress.  HENT:  Head: Normocephalic.  Right Ear: Tympanic membrane, external ear and ear canal normal.  Left Ear: Tympanic membrane, external ear and ear canal normal.  Nose: Nose normal. No mucosal edema or rhinorrhea.  Mouth/Throat: Uvula is midline, oropharynx is clear and moist and mucous membranes are normal. No oropharyngeal exudate.  Eyes: Conjunctivae are normal.  Neck: Trachea normal. No tracheal tenderness present. No tracheal deviation present. No thyromegaly present.  Cardiovascular: Normal rate, regular rhythm, S1 normal, S2 normal and normal heart sounds.   No murmur heard. Pulmonary/Chest: Breath sounds normal. No stridor. No respiratory distress. She has no wheezes. She has no rales.  Musculoskeletal: She exhibits no edema.  Lymphadenopathy:       Head (right side): No tonsillar adenopathy present.       Head (left side): No tonsillar adenopathy present.    She has no cervical adenopathy.  Neurological: She is alert. Gait normal.  Skin: No rash noted. She is not diaphoretic. No erythema. Nails show no clubbing.  Psychiatric: Mood and affect normal.    Diagnostics:    Spirometry was performed and demonstrated an FEV1 of 2.80 at 89 % of predicted.  The patient had an Asthma Control Test with the following results:  .    Assessment and Plan:   1. Asthma, severe persistent, well-controlled   2. Other allergic  rhinitis   3. Gastroesophageal reflux disease, esophagitis presence not specified   4. Food allergy      1. Continue to Treat reflux:   A. continue to work on Eliminating caffeine and chocolate  B. continue Dexilant 60 mg one tablet in a.m.  C. Ranitidine 300mg  one tablet in PM  2. Can use DuoNeb nebulization at home every 4-6 hours if needed.    3. Continue Dulera 200 - 2 inhalations twice a day  4. Add in Qvar 80 Redihaler - 2 inhalations twice a day during "flareup"  5. Continue nasal fluticasone 1-2 sprays each nostril daily  6. Continue montelukast 10 mg daily   7. Continue antihistamine, ibuprofen, Mucinex DM if needed  8. Continue immunotherapy and EpiPen  9. Continue Nucala injections  10. Return to clinic in 6 months or earlier if problem  11. Obtain fall flu vaccine  Waldo Laine appears to have developed a viral upper respiratory  tract infection and actually is improving at this point in time. I'm not really going to change any of her therapy. She has a very good understanding of her medications and how they work and the appropriate use of these medications. I will see her back in this clinic in 6 months or earlier if there is a problem.  Laurette SchimkeEric Brooklyn Alfredo, MD Allergy / Immunology [facility]

## 2017-01-18 NOTE — Patient Instructions (Addendum)
  1. Continue to Treat reflux:   A. continue to work on Eliminating caffeine and chocolate  B. continue Dexilant 60 mg one tablet in a.m.  C. Ranitidine 300mg  one tablet in PM  2. Can use DuoNeb nebulization at home every 4-6 hours if needed.    3. Continue Dulera 200 - 2 inhalations twice a day  4. Add in Qvar 80 Redihaler - 2 inhalations twice a day during "flareup"  5. Continue nasal fluticasone 1-2 sprays each nostril daily  6. Continue montelukast 10 mg daily   7. Continue antihistamine, ibuprofen, Mucinex DM if needed  8. Continue immunotherapy and EpiPen  9. Continue Nucala injections  10. Return to clinic in 6 months or earlier if problem  11. Obtain fall flu vaccine

## 2017-02-07 ENCOUNTER — Ambulatory Visit (INDEPENDENT_AMBULATORY_CARE_PROVIDER_SITE_OTHER): Payer: BLUE CROSS/BLUE SHIELD

## 2017-02-07 DIAGNOSIS — J4550 Severe persistent asthma, uncomplicated: Secondary | ICD-10-CM

## 2017-02-18 ENCOUNTER — Other Ambulatory Visit: Payer: Self-pay | Admitting: Allergy and Immunology

## 2017-02-20 ENCOUNTER — Other Ambulatory Visit: Payer: Self-pay | Admitting: Allergy and Immunology

## 2017-02-28 ENCOUNTER — Ambulatory Visit: Payer: Self-pay

## 2017-03-02 ENCOUNTER — Ambulatory Visit (INDEPENDENT_AMBULATORY_CARE_PROVIDER_SITE_OTHER): Payer: BLUE CROSS/BLUE SHIELD | Admitting: *Deleted

## 2017-03-02 DIAGNOSIS — J309 Allergic rhinitis, unspecified: Secondary | ICD-10-CM

## 2017-03-07 ENCOUNTER — Ambulatory Visit (INDEPENDENT_AMBULATORY_CARE_PROVIDER_SITE_OTHER): Payer: BLUE CROSS/BLUE SHIELD

## 2017-03-07 DIAGNOSIS — J4550 Severe persistent asthma, uncomplicated: Secondary | ICD-10-CM | POA: Diagnosis not present

## 2017-03-15 ENCOUNTER — Ambulatory Visit (INDEPENDENT_AMBULATORY_CARE_PROVIDER_SITE_OTHER): Payer: BLUE CROSS/BLUE SHIELD

## 2017-03-15 DIAGNOSIS — J309 Allergic rhinitis, unspecified: Secondary | ICD-10-CM | POA: Diagnosis not present

## 2017-03-23 ENCOUNTER — Ambulatory Visit (INDEPENDENT_AMBULATORY_CARE_PROVIDER_SITE_OTHER): Payer: BLUE CROSS/BLUE SHIELD | Admitting: *Deleted

## 2017-03-23 DIAGNOSIS — J309 Allergic rhinitis, unspecified: Secondary | ICD-10-CM

## 2017-03-31 ENCOUNTER — Other Ambulatory Visit: Payer: Self-pay | Admitting: Allergy and Immunology

## 2017-03-31 MED ORDER — BECLOMETHASONE DIPROP HFA 80 MCG/ACT IN AERB
2.0000 | INHALATION_SPRAY | Freq: Two times a day (BID) | RESPIRATORY_TRACT | 3 refills | Status: DC
Start: 2017-03-31 — End: 2018-07-10

## 2017-04-04 ENCOUNTER — Ambulatory Visit: Payer: Self-pay

## 2017-04-06 ENCOUNTER — Ambulatory Visit: Payer: Self-pay

## 2017-04-07 ENCOUNTER — Ambulatory Visit: Payer: BLUE CROSS/BLUE SHIELD

## 2017-04-07 ENCOUNTER — Ambulatory Visit (INDEPENDENT_AMBULATORY_CARE_PROVIDER_SITE_OTHER): Payer: BLUE CROSS/BLUE SHIELD

## 2017-04-07 DIAGNOSIS — J309 Allergic rhinitis, unspecified: Secondary | ICD-10-CM

## 2017-04-07 DIAGNOSIS — J4550 Severe persistent asthma, uncomplicated: Secondary | ICD-10-CM | POA: Diagnosis not present

## 2017-04-15 ENCOUNTER — Ambulatory Visit (INDEPENDENT_AMBULATORY_CARE_PROVIDER_SITE_OTHER): Payer: BLUE CROSS/BLUE SHIELD | Admitting: *Deleted

## 2017-04-15 DIAGNOSIS — J309 Allergic rhinitis, unspecified: Secondary | ICD-10-CM

## 2017-04-24 ENCOUNTER — Other Ambulatory Visit: Payer: Self-pay | Admitting: Allergy and Immunology

## 2017-05-10 ENCOUNTER — Ambulatory Visit (INDEPENDENT_AMBULATORY_CARE_PROVIDER_SITE_OTHER): Payer: BLUE CROSS/BLUE SHIELD | Admitting: *Deleted

## 2017-05-10 DIAGNOSIS — J4550 Severe persistent asthma, uncomplicated: Secondary | ICD-10-CM | POA: Diagnosis not present

## 2017-05-13 ENCOUNTER — Ambulatory Visit (INDEPENDENT_AMBULATORY_CARE_PROVIDER_SITE_OTHER): Payer: BLUE CROSS/BLUE SHIELD

## 2017-05-13 ENCOUNTER — Other Ambulatory Visit: Payer: Self-pay | Admitting: Allergy and Immunology

## 2017-05-13 DIAGNOSIS — J309 Allergic rhinitis, unspecified: Secondary | ICD-10-CM | POA: Diagnosis not present

## 2017-05-16 ENCOUNTER — Other Ambulatory Visit: Payer: Self-pay | Admitting: Family Medicine

## 2017-05-16 DIAGNOSIS — R1031 Right lower quadrant pain: Secondary | ICD-10-CM

## 2017-05-16 DIAGNOSIS — R109 Unspecified abdominal pain: Secondary | ICD-10-CM

## 2017-05-17 ENCOUNTER — Other Ambulatory Visit: Payer: Self-pay | Admitting: Family Medicine

## 2017-05-17 ENCOUNTER — Ambulatory Visit
Admission: RE | Admit: 2017-05-17 | Discharge: 2017-05-17 | Disposition: A | Discharge status: Home / Self Care | Payer: BLUE CROSS/BLUE SHIELD | Source: Ambulatory Visit | Attending: Family Medicine | Admitting: Family Medicine

## 2017-05-17 DIAGNOSIS — R109 Unspecified abdominal pain: Secondary | ICD-10-CM

## 2017-05-17 DIAGNOSIS — R1031 Right lower quadrant pain: Secondary | ICD-10-CM

## 2017-05-19 ENCOUNTER — Other Ambulatory Visit: Payer: Self-pay | Admitting: Family Medicine

## 2017-05-19 DIAGNOSIS — R1031 Right lower quadrant pain: Secondary | ICD-10-CM

## 2017-05-25 ENCOUNTER — Inpatient Hospital Stay
Admission: RE | Admit: 2017-05-25 | Discharge: 2017-05-25 | Disposition: A | Discharge status: Home / Self Care | Payer: BLUE CROSS/BLUE SHIELD | Source: Ambulatory Visit | Attending: Family Medicine | Admitting: Family Medicine

## 2017-05-31 ENCOUNTER — Other Ambulatory Visit: Payer: Self-pay | Admitting: Allergy and Immunology

## 2017-06-07 ENCOUNTER — Ambulatory Visit (INDEPENDENT_AMBULATORY_CARE_PROVIDER_SITE_OTHER): Payer: BLUE CROSS/BLUE SHIELD | Admitting: *Deleted

## 2017-06-07 DIAGNOSIS — J4550 Severe persistent asthma, uncomplicated: Secondary | ICD-10-CM | POA: Diagnosis not present

## 2017-06-13 ENCOUNTER — Ambulatory Visit (INDEPENDENT_AMBULATORY_CARE_PROVIDER_SITE_OTHER): Payer: BLUE CROSS/BLUE SHIELD | Admitting: *Deleted

## 2017-06-13 DIAGNOSIS — J309 Allergic rhinitis, unspecified: Secondary | ICD-10-CM

## 2017-06-22 ENCOUNTER — Other Ambulatory Visit: Payer: Self-pay | Admitting: Allergy and Immunology

## 2017-06-24 ENCOUNTER — Other Ambulatory Visit: Payer: Self-pay | Admitting: Allergy and Immunology

## 2017-07-06 ENCOUNTER — Ambulatory Visit: Payer: BLUE CROSS/BLUE SHIELD

## 2017-07-07 ENCOUNTER — Other Ambulatory Visit: Payer: Self-pay | Admitting: Allergy and Immunology

## 2017-07-08 ENCOUNTER — Ambulatory Visit: Payer: BLUE CROSS/BLUE SHIELD | Admitting: Allergy

## 2017-07-08 ENCOUNTER — Ambulatory Visit: Payer: Self-pay

## 2017-07-13 ENCOUNTER — Ambulatory Visit: Payer: BLUE CROSS/BLUE SHIELD | Admitting: Allergy and Immunology

## 2017-07-15 ENCOUNTER — Ambulatory Visit: Payer: BLUE CROSS/BLUE SHIELD | Admitting: Allergy

## 2017-07-15 ENCOUNTER — Ambulatory Visit: Payer: BLUE CROSS/BLUE SHIELD

## 2017-07-22 ENCOUNTER — Ambulatory Visit (INDEPENDENT_AMBULATORY_CARE_PROVIDER_SITE_OTHER): Payer: BLUE CROSS/BLUE SHIELD

## 2017-07-22 DIAGNOSIS — J4550 Severe persistent asthma, uncomplicated: Secondary | ICD-10-CM

## 2017-08-18 ENCOUNTER — Ambulatory Visit (INDEPENDENT_AMBULATORY_CARE_PROVIDER_SITE_OTHER): Payer: BLUE CROSS/BLUE SHIELD | Admitting: Allergy

## 2017-08-18 ENCOUNTER — Encounter: Payer: Self-pay | Admitting: Allergy

## 2017-08-18 VITALS — BP 108/64 | HR 81 | Ht 63.0 in | Wt 114.4 lb

## 2017-08-18 DIAGNOSIS — K219 Gastro-esophageal reflux disease without esophagitis: Secondary | ICD-10-CM

## 2017-08-18 DIAGNOSIS — J4550 Severe persistent asthma, uncomplicated: Secondary | ICD-10-CM

## 2017-08-18 DIAGNOSIS — Z91018 Allergy to other foods: Secondary | ICD-10-CM | POA: Diagnosis not present

## 2017-08-18 DIAGNOSIS — J302 Other seasonal allergic rhinitis: Secondary | ICD-10-CM

## 2017-08-18 DIAGNOSIS — J3089 Other allergic rhinitis: Secondary | ICD-10-CM

## 2017-08-18 DIAGNOSIS — H1010 Acute atopic conjunctivitis, unspecified eye: Secondary | ICD-10-CM | POA: Diagnosis not present

## 2017-08-18 NOTE — Patient Instructions (Addendum)
1. Seasonal and perennial allergic rhinitis - The risks and benefits of aeroallergen immunotherapy have been discussed. The patient is motivated to initiate immunotherapy to reduce symptoms and decrease medication requirement. Informed consent has been signed and allergen vaccine orders have been submitted. Medications will be decreased or discontinued as symptom relief from immunotherapy becomes evident. - Continue Xyzal 5 mg once a day as needed for runny nose or itching - Continue Flonase 2 sparys in each nostril once a day as needed for stuffy nose - Allergen avoidance   2. Seasonal allergic conjunctivitis - Continue olopatadine eye drops as needed  3. Severe persistent asthma, uncomplicated - Continue Nucala injections - Continue Dulera 2 puffs twice a day - Add on Qvar during asthma flares as needed - Albuterol inhaler 2 puffs every 4 hours as needed for cough, wheeze, or shortness of breath   4. Gastroesophageal reflux disease, esophagitis presence not specified - Continue Dexilant daily - Consolidate caffeine and chocolate intake  5. Food allergy - Avoid oysters, walnut, almond, hazelnut, coconut, and pistachio. If you have an allergic reaction, give Benadryl, and I f you have life-threatening symptoms, inject with EpiPen.  - Action plan provided  Follow up in 2 weeks for injection

## 2017-08-18 NOTE — Progress Notes (Signed)
[address] [place] [address] Dept: [phone]  FOLLOW UP NOTE  Patient ID: Elizabeth Booker, female    DOB: 1995/10/24  Age: 22 y.o. MRN: [id number] Date of Office Visit: 08/18/2017  Assessment  Chief Complaint: Allergy Testing (Pt presents to have allergy testing to environmentals and foods.)  HPI Elizabeth Booker is a 22 year old female who presents to the clinic for evaluation of allergies. She is accompanied by her father who assists with history. She was last seen in this clinic by Dr. Lucie Leather on 01/18/2017 for evaluation of asthma treated with mepolizumab, allergic rhinitis, food allergy, and reflux. At that time, she was experiencing an upper respiratory infection and had not introduced her action plan adding Qvar to her Dulera at the onset of this issue.  As she was improving at that time her medication therapy remained the same as on previous visits. Danya has been receiving allergen immunotherapy   At today's visit, she reports that she has been receiving allergy immunotherapy for the last 5 years and is not getting the full benefit from the injections. She was tested and her allergy vials were made at a different allergy specialist. She is in the office today to be retested and have new immunotherapy vials made.   She reports asthma is not well controlled.  She reports shortness of breath at rest and with activity, dry nonproductive cough, and needing to stop and catch her breath during daily activities.  She denies wheezing.  She is currently using Dulera 200-5 two puffs twice a day, montelukast 10 mg once a day, and receiving mepolizumab 100 mg injections every 28 days.  She reports using her ProAir inhaler 2 times a week and she has not needed to use the Qvar inhaler.  Allergic rhinitis is reported is not well controlled.  Her symptoms include nasal congestion, runny nose, and sneezing.  She currently takes levocetirizine 5 mg once a day in the evening and is using  Flonase nasal spray as needed.  She reports her eyes as dry and itchy and uses Patanol eyedrops as needed.  She reports that raw fruits and vegetables make her mouth feel funny and her throat swell.  Benadryl results the itching and swelling over several hours.  She can eat peaches, varies, and cooked fruits and vegetables with no throat swelling or mouth tingling.  Allergy testing at a different allergy office indicated positive allergy to shellfish and peanut, both of which she eats currently with no problems.  The food testing at the other allergy office was also positive for tree nuts which she does not eat because she does not like.  She is currently not avoiding any foods.  Reflux is reported as well controlled with Dexilant daily.   Drug Allergies:  Allergies  Allergen Reactions  . Cucumber Extract Anaphylaxis  . Mango Flavor Anaphylaxis    fruit  . Onion Anaphylaxis    raw  . Other Anaphylaxis    Cantaloupe, most vegetables and fruits  . Watermelon Concentrate [Citrullus Vulgaris] Anaphylaxis  . Iohexol Swelling    Throat swelling oral , per Dr. Fredirick Lathe per Pre meds with IV contrast or do scan without //rls    Physical Exam: BP 108/64 (BP Location: Left Arm, Patient Position: Sitting, Cuff Size: Normal)   Pulse 81   Ht 5\' 3"  (1.6 m)   Wt 114 lb 6.4 oz (51.9 kg)   SpO2 96%   BMI 20.27 kg/m    Physical Exam  Constitutional: She is oriented to person, place, and time. She appears well-developed and well-nourished.  HENT:  Right Ear: External ear normal.  Left Ear: External ear normal.  Eyes normal.  Ears normal.  Pharynx normal.  Bilateral nares slightly erythematous and edematous with no drainage noted.  Eyes: Conjunctivae are normal.  Neck: Normal range of motion. Neck supple.  Cardiovascular: Normal rate, regular rhythm and normal heart sounds.  S1-S2 normal.  Regular heart rate and rhythm.  No murmur noted.  Pulmonary/Chest: Effort normal and breath sounds normal.    Lungs clear to auscultation  Musculoskeletal: Normal range of motion.  Neurological: She is alert and oriented to person, place, and time.  Skin: Skin is warm and dry.  Psychiatric: She has a normal mood and affect. Her behavior is normal.    Diagnostics: FVC 3.63, FEV1 3.09.  Predicted FVC 3.67, predicted FEV1 3.22.  Spirometry reveals normal ventilatory function.  Environmental allergy skin prick testing is positive to grasses, weeds, trees, molds, dust mites, cat, dog, mixed feathers, horse, tobacco leaf  Food allergy skin prick testing is positive to peanut, walnut, almond, hazelnut, coconut, pistachio, oyster.     Assessment and Plan: 1. Seasonal and perennial allergic rhinitis   2. Seasonal allergic conjunctivitis   3. Severe persistent asthma, uncomplicated   4. Gastroesophageal reflux disease, esophagitis presence not specified   5. Food allergy      Patient Instructions  1. Seasonal and perennial allergic rhinitis - The risks and benefits of aeroallergen immunotherapy have been discussed. The patient is motivated to initiate immunotherapy to reduce symptoms and decrease medication requirement. Informed consent has been signed and allergen vaccine orders have been submitted. Medications will be decreased or discontinued as symptom relief from immunotherapy becomes evident. - Continue Xyzal 5 mg once a day as needed for runny nose or itching - Continue Flonase 2 sparys in each nostril once a day as needed for stuffy nose - Allergen avoidance   2. Seasonal allergic conjunctivitis - Continue olopatadine eye drops as needed  3. Severe persistent asthma, uncomplicated - Continue Nucala injections - Continue Dulera 2 puffs twice a day - Add on Qvar during asthma flares as needed - Albuterol inhaler 2 puffs every 4 hours as needed for cough, wheeze, or shortness of breath   4. Gastroesophageal reflux disease, esophagitis presence not specified - Continue Dexilant daily -  Consolidate caffeine and chocolate intake  5. Food allergy - Avoid oysters, walnut, almond, hazelnut, coconut, and pistachio. If you have an allergic reaction, give Benadryl, and I f you have life-threatening symptoms, inject with EpiPen.  - Action plan provided  Follow up in 2 weeks for injection   Thank you for the opportunity to care for this patient.  Please do not hesitate to contact me with questions.  Thermon LeylandAnne Ambs, FNP [facility]   I performed/discussed the history and physical examination of the patient as well as management with NP Ambs. I reviewed the NP's note and agree with the documented findings and plan of care with following additions/exceptions:  She remains very sensitive to environmental allergens despite course of immunotherapy.  She is currently getting her vials through [facility] with administration in our office.  We will now make new vials based on testing today and pt is aware she is going to start over with build-up and is agreeable to this.    Margo AyeShaylar Padgett, MD [facility]

## 2017-08-19 ENCOUNTER — Encounter: Payer: Self-pay | Admitting: *Deleted

## 2017-08-19 ENCOUNTER — Ambulatory Visit: Payer: BLUE CROSS/BLUE SHIELD

## 2017-08-19 DIAGNOSIS — J4550 Severe persistent asthma, uncomplicated: Secondary | ICD-10-CM

## 2017-08-19 NOTE — Addendum Note (Signed)
Addended by: Lorrin MaisPADGETT, SHAYLAR P on: 08/19/2017 09:28 AM   Modules accepted: Orders

## 2017-08-19 NOTE — Progress Notes (Signed)
4 VIAL SET MADE. EXP: 08-19-18. HV 

## 2017-08-24 DIAGNOSIS — J301 Allergic rhinitis due to pollen: Secondary | ICD-10-CM | POA: Diagnosis not present

## 2017-08-25 DIAGNOSIS — J3089 Other allergic rhinitis: Secondary | ICD-10-CM | POA: Diagnosis not present

## 2017-09-08 ENCOUNTER — Other Ambulatory Visit: Payer: Self-pay | Admitting: Allergy and Immunology

## 2017-09-09 ENCOUNTER — Ambulatory Visit: Payer: BLUE CROSS/BLUE SHIELD

## 2017-09-16 ENCOUNTER — Ambulatory Visit (INDEPENDENT_AMBULATORY_CARE_PROVIDER_SITE_OTHER): Payer: BLUE CROSS/BLUE SHIELD

## 2017-09-16 DIAGNOSIS — J4550 Severe persistent asthma, uncomplicated: Secondary | ICD-10-CM

## 2017-09-28 ENCOUNTER — Telehealth: Payer: Self-pay | Admitting: Allergy

## 2017-09-28 NOTE — Telephone Encounter (Signed)
Patients father is calling with concern about drug interaction States patient is complaining of blackouts and altered mental status ( coming and going ) Says its been going on about a month, patient just now told father Please call

## 2017-09-28 NOTE — Telephone Encounter (Signed)
Spoke with father, he will come in the [place] office for you to see her this afternoon if she needs to. Doesn't know which medications are causing the problem but she's taking a lot of meds from our office. Please advise

## 2017-09-28 NOTE — Telephone Encounter (Signed)
Please have her see me in clinic to work through this issue.

## 2017-09-28 NOTE — Telephone Encounter (Signed)
Spoke to father advised we would see patient in [place] clinic 09/29/17 at 8:30a. Father verbalized understanding.

## 2017-09-29 ENCOUNTER — Encounter: Payer: Self-pay | Admitting: Allergy and Immunology

## 2017-09-29 ENCOUNTER — Ambulatory Visit: Payer: BLUE CROSS/BLUE SHIELD | Admitting: Allergy and Immunology

## 2017-09-29 VITALS — BP 110/70 | HR 100 | Resp 18

## 2017-09-29 DIAGNOSIS — K219 Gastro-esophageal reflux disease without esophagitis: Secondary | ICD-10-CM | POA: Diagnosis not present

## 2017-09-29 DIAGNOSIS — Z91018 Allergy to other foods: Secondary | ICD-10-CM

## 2017-09-29 DIAGNOSIS — J3089 Other allergic rhinitis: Secondary | ICD-10-CM | POA: Diagnosis not present

## 2017-09-29 DIAGNOSIS — F458 Other somatoform disorders: Secondary | ICD-10-CM

## 2017-09-29 DIAGNOSIS — F3289 Other specified depressive episodes: Secondary | ICD-10-CM | POA: Diagnosis not present

## 2017-09-29 DIAGNOSIS — J4550 Severe persistent asthma, uncomplicated: Secondary | ICD-10-CM | POA: Diagnosis not present

## 2017-09-29 DIAGNOSIS — H1010 Acute atopic conjunctivitis, unspecified eye: Secondary | ICD-10-CM

## 2017-09-29 NOTE — Progress Notes (Signed)
Follow-up Note  Referring Provider: Loyola Mast, MD Primary Provider: Loyola Mast, MD Date of Office Visit: 09/29/2017  Subjective:   Elizabeth Booker (DOB: 07-Jan-1996) is a 22 y.o. female who [facility]2017 in re-evaluation of the following:  HPI: Elizabeth Booker presents to this clinic in evaluation of asthma and allergic rhinitis and food allergy and reflux.  I have not seen her in this clinic since 18 January 2017 but she did visit with our nurse practitioner on 18 August 2017 for continued respiratory tract symptoms.  The big issue today is that Elizabeth Booker has been developing episodes of nausea and then confusion and disorientation that can last several seconds to 5 minutes associated with feeling lightheaded and dizzy and chest pressure over the course of the past 6 weeks or so.  There are no other associated systemic or constitutional symptoms with this scenario.  They can occur 2 times per day and sometimes 4 times per day.  Her Booker accompanied Elizabeth Booker today and felt that she may be suffering from a side effect from 1 of the medications that she has been using as she does use a fair amount of medications.  As well, there may have been an issue with exposure to carbon monoxide as her jeep apparently had a exhaust issue but that has been rectified this week.  On further questioning of this 6-week issue it does appear that Elizabeth Booker is very depressed.  She is avoiding people and she is avoiding conversation with people and she is not enjoying anything that she would enjoy in the past.  Her sleep does not appear to be disturbed.  I asked Elizabeth Booker to leave the room and I spoke with her alone and she had a fair amount of crying and was upset that her life appears to be somewhat of a disappointment in regard to her expectations.  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  She had to drop a class last semester and a class this semester because of difficulty performing schoolwork.  She does not have any intention of hurting herself but she just feels very sad about her situation at this point.  Allergies as of 09/29/2017      Reactions   Cucumber Extract Anaphylaxis   Mango Flavor Anaphylaxis   fruit   Onion Anaphylaxis   raw   Other Anaphylaxis   Cantaloupe, most vegetables and fruits   Watermelon Concentrate [citrullus Vulgaris] Anaphylaxis   Iohexol Swelling   Throat swelling oral , per Dr. Fredirick Lathe per Pre meds with IV contrast or do scan without //rls      Medication List      beclomethasone 80 MCG/ACT inhaler Commonly known as:  QVAR REDIHALER Inhale 2 puffs into the lungs 2 (two) times daily. With asthma flareup   DEXILANT 60 MG capsule Generic drug:  dexlansoprazole TAKE 1 CAPSULE BY MOUTH ONCE DAILY   DULERA 200-5 MCG/ACT Aero Generic drug:  mometasone-formoterol INHALE 2 PUFFS BY MOUTH TWICE DAILY   EPINEPHrine 0.3 mg/0.3 mL Soaj injection Commonly known as:  EPI-PEN INJECT 0.3ML INTRAMUSCULARLY AS DIRECTED   fluticasone 50 MCG/ACT nasal spray Commonly known as:  FLONASE Place 2 sprays into both nostrils as needed.   ibuprofen 200 MG tablet Commonly known as:  ADVIL,MOTRIN Take 400 mg by mouth every 6 (six)  hours as needed. For pain   ipratropium-albuterol 0.5-2.5 (3) MG/3ML Soln Commonly known as:  DUONEB USE 1 VIAL IN NEBULIZER EVERY 6 HOURS AS NEEDED   levocetirizine 5 MG tablet Commonly known as:  XYZAL Take 5 mg by mouth every evening.   montelukast 10 MG tablet Commonly known as:  SINGULAIR Take one tablet once daily as directed   NUCALA 100 MG Solr Generic drug:  Mepolizumab INJECT 100MG  SUBCUTANEOUSLY EVERY 4 WEEKS.   NUVARING 0.12-0.015 MG/24HR vaginal ring Generic drug:  etonogestrel-ethinyl estradiol   PATANOL OP Apply to eye. Uses PRN   PROAIR HFA 108 (90 Base)  MCG/ACT inhaler Generic drug:  albuterol INHALE 2 PUFFS BY MOUTH EVERY 4 HOURS AS NEEDED FOR COUGH AND WHEEZING. MAY USE 2 PUFFS 10 TO 20 MINUTES PRIOR TO EXERCISE   ranitidine 300 MG tablet Commonly known as:  ZANTAC TAKE 1 TABLET BY MOUTH AT BEDTIME       Past Medical History:  Diagnosis Date  . Allergy   . Asthma   . GERD (gastroesophageal reflux disease)   . Kidney infection     Past Surgical History:  Procedure Laterality Date  . ESOPHAGEAL MANOMETRY N/A 12/15/2016   Procedure: ESOPHAGEAL MANOMETRY (EM);  Surgeon: Iva Boop, MD;  Location: [facility];  Service: Endoscopy;  Laterality: N/A;  . ESOPHAGOGASTRODUODENOSCOPY (EGD) WITH ESOPHAGEAL DILATION  08/30/2016  . MOUTH SURGERY    . PH IMPEDANCE STUDY N/A 12/15/2016   Procedure: PH IMPEDANCE STUDY;  Surgeon: Iva Boop, MD;  Location: [facility];  Service: Endoscopy;  Laterality: N/A;    Review of systems negative except as noted in HPI / PMHx or noted below:  Review of Systems  Constitutional: Negative.   HENT: Negative.   Eyes: Negative.   Respiratory: Negative.   Cardiovascular: Negative.   Gastrointestinal: Negative.   Genitourinary: Negative.   Musculoskeletal: Negative.   Skin: Negative.   Neurological: Negative.   Endo/Heme/Allergies: Negative.   Psychiatric/Behavioral: Negative.      Objective:   Vitals:   09/29/17 0850  BP: 110/70  Pulse: 100  Resp: 18          Physical Exam  Constitutional: She is well-developed, well-nourished, and in no distress.  HENT:  Head: Normocephalic.  Right Ear: Tympanic membrane, external ear and ear canal normal.  Left Ear: Tympanic membrane, external ear and ear canal normal.  Nose: Nose normal. No mucosal edema or rhinorrhea.  Mouth/Throat: Uvula is midline, oropharynx is clear and moist and mucous membranes are normal. No oropharyngeal exudate.  Eyes: Conjunctivae are normal.  Neck: Trachea normal. No tracheal tenderness present. No tracheal  deviation present. No thyromegaly present.  Cardiovascular: Normal rate, regular rhythm, S1 normal, S2 normal and normal heart sounds.  No murmur heard. Pulmonary/Chest: Breath sounds normal. No stridor. No respiratory distress. She has no wheezes. She has no rales.  Musculoskeletal: She exhibits no edema.  Lymphadenopathy:       Head (right side): No tonsillar adenopathy present.       Head (left side): No tonsillar adenopathy present.    She has no cervical adenopathy.  Neurological: She is alert. Gait normal.  Skin: No rash noted. She is not diaphoretic. No erythema. Nails show no clubbing.  Psychiatric: Mood and affect normal.    Diagnostics:    Spirometry was performed and demonstrated an FEV1 of 3.24 at 100 % of predicted.  The patient had an Asthma Control Test with the following results: ACT Total Score: 19.  Assessment and Plan:   1. Other depression   2. Hyperventilation syndrome   3. Asthma, severe persistent, well-controlled   4. Other allergic rhinitis   5. Seasonal allergic conjunctivitis   6. Food allergy   7. Gastroesophageal reflux disease, esophagitis presence not specified      1. Continue to Treat reflux:   A. Dexilant 60 mg one tablet in a.m.  B. Ranitidine 300mg  one tablet in PM  2. Continue Dulera 200 - 2 inhalations twice a day  3. Add in Qvar 80 Redihaler - 2 inhalations twice a day to Dulera 200 - 2 inhalations twice a day during "flareup"  4. Continue nasal fluticasone 1-2 sprays each nostril daily  5. Continue montelukast 10 mg daily   6. Continue antihistamine (Xyzal), ibuprofen, Mucinex DM, Paper bag rebreathing technique,  if needed  7. Continue immunotherapy and EpiPen  8. Continue Nucala injections  9. [facility] with [facility] today  10. Blood - Tryptase   11. Return to clinic in one month or earlier if problem  Elizabeth AlanisKaitlyn appears to have significant depression and possibly hyperventilation syndrome and I have  encouraged her to visit with [facility] today.  To be complete some of her symptoms could represent a form of anaphylaxis and will screen her blood for elevated tryptase.  I had a talk with her today about utilizing paper bag rebreathing technique for hyperventilation.  She will continue on anti-inflammatory therapy for her respiratory tract and therapy against reflux as noted above.  Laurette SchimkeEric Kozlow, MD Allergy / Immunology [facility]

## 2017-09-29 NOTE — Patient Instructions (Addendum)
  1. Continue to Treat reflux:   A. Dexilant 60 mg one tablet in a.m.  B. Ranitidine 300mg  one tablet in PM  2. Continue Dulera 200 - 2 inhalations twice a day  3. Add in Qvar 80 Redihaler - 2 inhalations twice a day to Dulera 200 - 2 inhalations twice a day during "flareup"  4. Continue nasal fluticasone 1-2 sprays each nostril daily  5. Continue montelukast 10 mg daily   6. Continue antihistamine (Xyzal), ibuprofen, Mucinex DM, Paper bag rebreathing technique,  if needed  7. Continue immunotherapy and EpiPen  8. Continue Nucala injections  9. Visit with [facility] today  10. Blood - Tryptase   11.  return to clinic in one month or earlier if problem

## 2017-10-03 ENCOUNTER — Encounter: Payer: Self-pay | Admitting: Allergy and Immunology

## 2017-10-05 ENCOUNTER — Ambulatory Visit: Payer: Self-pay | Admitting: *Deleted

## 2017-10-05 DIAGNOSIS — J309 Allergic rhinitis, unspecified: Secondary | ICD-10-CM

## 2017-10-05 NOTE — Progress Notes (Signed)
Immunotherapy   Patient Details  Name: Elizabeth DashKaitlyn N Moody MRN: [id number] Date of Birth: [birth year]  10/05/2017  Elizabeth DashKaitlyn N Moody started injections for  Pollen-Pet/Mold-Mite-Weed Following schedule: B  Frequency:1 time per week Epi-Pen:Epi-Pen Available  Consent signed and patient instructions given.   Bennye AlmMildred Dolorez Jeffrey 10/05/2017, 3:31 PM

## 2017-10-12 ENCOUNTER — Ambulatory Visit (INDEPENDENT_AMBULATORY_CARE_PROVIDER_SITE_OTHER): Payer: BLUE CROSS/BLUE SHIELD | Admitting: *Deleted

## 2017-10-12 DIAGNOSIS — J309 Allergic rhinitis, unspecified: Secondary | ICD-10-CM

## 2017-10-14 ENCOUNTER — Ambulatory Visit: Payer: Self-pay

## 2017-10-18 ENCOUNTER — Ambulatory Visit (INDEPENDENT_AMBULATORY_CARE_PROVIDER_SITE_OTHER): Payer: BLUE CROSS/BLUE SHIELD | Admitting: *Deleted

## 2017-10-18 DIAGNOSIS — J4550 Severe persistent asthma, uncomplicated: Secondary | ICD-10-CM

## 2017-10-20 ENCOUNTER — Ambulatory Visit (INDEPENDENT_AMBULATORY_CARE_PROVIDER_SITE_OTHER): Payer: BLUE CROSS/BLUE SHIELD | Admitting: *Deleted

## 2017-10-20 DIAGNOSIS — J309 Allergic rhinitis, unspecified: Secondary | ICD-10-CM

## 2017-10-21 ENCOUNTER — Other Ambulatory Visit: Payer: Self-pay | Admitting: Allergy and Immunology

## 2017-11-03 ENCOUNTER — Ambulatory Visit (INDEPENDENT_AMBULATORY_CARE_PROVIDER_SITE_OTHER): Payer: BLUE CROSS/BLUE SHIELD | Admitting: *Deleted

## 2017-11-03 DIAGNOSIS — J309 Allergic rhinitis, unspecified: Secondary | ICD-10-CM

## 2017-11-10 ENCOUNTER — Ambulatory Visit (INDEPENDENT_AMBULATORY_CARE_PROVIDER_SITE_OTHER): Payer: BLUE CROSS/BLUE SHIELD | Admitting: *Deleted

## 2017-11-10 DIAGNOSIS — J309 Allergic rhinitis, unspecified: Secondary | ICD-10-CM

## 2017-11-15 ENCOUNTER — Ambulatory Visit (INDEPENDENT_AMBULATORY_CARE_PROVIDER_SITE_OTHER): Payer: BLUE CROSS/BLUE SHIELD | Admitting: *Deleted

## 2017-11-15 DIAGNOSIS — J4550 Severe persistent asthma, uncomplicated: Secondary | ICD-10-CM | POA: Diagnosis not present

## 2017-11-22 ENCOUNTER — Ambulatory Visit (INDEPENDENT_AMBULATORY_CARE_PROVIDER_SITE_OTHER): Payer: BLUE CROSS/BLUE SHIELD | Admitting: *Deleted

## 2017-11-22 DIAGNOSIS — J309 Allergic rhinitis, unspecified: Secondary | ICD-10-CM

## 2017-11-26 ENCOUNTER — Other Ambulatory Visit: Payer: Self-pay | Admitting: Allergy and Immunology

## 2017-12-08 ENCOUNTER — Ambulatory Visit (INDEPENDENT_AMBULATORY_CARE_PROVIDER_SITE_OTHER): Payer: BLUE CROSS/BLUE SHIELD | Admitting: *Deleted

## 2017-12-08 DIAGNOSIS — J309 Allergic rhinitis, unspecified: Secondary | ICD-10-CM | POA: Diagnosis not present

## 2017-12-12 ENCOUNTER — Other Ambulatory Visit: Payer: Self-pay | Admitting: Allergy and Immunology

## 2017-12-13 ENCOUNTER — Ambulatory Visit: Payer: BLUE CROSS/BLUE SHIELD

## 2017-12-15 ENCOUNTER — Ambulatory Visit (INDEPENDENT_AMBULATORY_CARE_PROVIDER_SITE_OTHER): Payer: BLUE CROSS/BLUE SHIELD

## 2017-12-15 ENCOUNTER — Ambulatory Visit: Payer: Self-pay

## 2017-12-15 DIAGNOSIS — J4550 Severe persistent asthma, uncomplicated: Secondary | ICD-10-CM | POA: Diagnosis not present

## 2017-12-29 ENCOUNTER — Ambulatory Visit (INDEPENDENT_AMBULATORY_CARE_PROVIDER_SITE_OTHER): Payer: BLUE CROSS/BLUE SHIELD | Admitting: *Deleted

## 2017-12-29 DIAGNOSIS — J309 Allergic rhinitis, unspecified: Secondary | ICD-10-CM

## 2018-01-11 ENCOUNTER — Ambulatory Visit (INDEPENDENT_AMBULATORY_CARE_PROVIDER_SITE_OTHER): Payer: BLUE CROSS/BLUE SHIELD

## 2018-01-11 DIAGNOSIS — J4550 Severe persistent asthma, uncomplicated: Secondary | ICD-10-CM | POA: Diagnosis not present

## 2018-01-20 ENCOUNTER — Ambulatory Visit (INDEPENDENT_AMBULATORY_CARE_PROVIDER_SITE_OTHER): Payer: BLUE CROSS/BLUE SHIELD

## 2018-01-20 DIAGNOSIS — J309 Allergic rhinitis, unspecified: Secondary | ICD-10-CM

## 2018-02-01 ENCOUNTER — Ambulatory Visit (INDEPENDENT_AMBULATORY_CARE_PROVIDER_SITE_OTHER): Payer: BLUE CROSS/BLUE SHIELD | Admitting: *Deleted

## 2018-02-01 DIAGNOSIS — J309 Allergic rhinitis, unspecified: Secondary | ICD-10-CM

## 2018-02-08 ENCOUNTER — Ambulatory Visit (INDEPENDENT_AMBULATORY_CARE_PROVIDER_SITE_OTHER): Payer: BLUE CROSS/BLUE SHIELD | Admitting: *Deleted

## 2018-02-08 DIAGNOSIS — J4550 Severe persistent asthma, uncomplicated: Secondary | ICD-10-CM

## 2018-02-10 ENCOUNTER — Ambulatory Visit (INDEPENDENT_AMBULATORY_CARE_PROVIDER_SITE_OTHER): Payer: BLUE CROSS/BLUE SHIELD

## 2018-02-10 DIAGNOSIS — J309 Allergic rhinitis, unspecified: Secondary | ICD-10-CM

## 2018-02-11 ENCOUNTER — Other Ambulatory Visit: Payer: Self-pay | Admitting: Allergy and Immunology

## 2018-02-17 ENCOUNTER — Ambulatory Visit (INDEPENDENT_AMBULATORY_CARE_PROVIDER_SITE_OTHER): Payer: BLUE CROSS/BLUE SHIELD

## 2018-02-17 DIAGNOSIS — J309 Allergic rhinitis, unspecified: Secondary | ICD-10-CM

## 2018-03-08 ENCOUNTER — Ambulatory Visit (INDEPENDENT_AMBULATORY_CARE_PROVIDER_SITE_OTHER): Payer: BLUE CROSS/BLUE SHIELD

## 2018-03-08 DIAGNOSIS — J4550 Severe persistent asthma, uncomplicated: Secondary | ICD-10-CM

## 2018-03-10 ENCOUNTER — Ambulatory Visit (INDEPENDENT_AMBULATORY_CARE_PROVIDER_SITE_OTHER): Payer: BLUE CROSS/BLUE SHIELD

## 2018-03-10 DIAGNOSIS — J309 Allergic rhinitis, unspecified: Secondary | ICD-10-CM

## 2018-03-28 ENCOUNTER — Other Ambulatory Visit: Payer: Self-pay | Admitting: Allergy

## 2018-03-28 ENCOUNTER — Other Ambulatory Visit: Payer: Self-pay | Admitting: Allergy and Immunology

## 2018-04-04 ENCOUNTER — Ambulatory Visit (INDEPENDENT_AMBULATORY_CARE_PROVIDER_SITE_OTHER): Payer: BLUE CROSS/BLUE SHIELD | Admitting: *Deleted

## 2018-04-04 DIAGNOSIS — J309 Allergic rhinitis, unspecified: Secondary | ICD-10-CM

## 2018-04-05 ENCOUNTER — Ambulatory Visit: Payer: Self-pay

## 2018-04-06 ENCOUNTER — Other Ambulatory Visit: Payer: Self-pay | Admitting: Allergy and Immunology

## 2018-04-11 DIAGNOSIS — J4550 Severe persistent asthma, uncomplicated: Secondary | ICD-10-CM | POA: Diagnosis not present

## 2018-04-12 ENCOUNTER — Ambulatory Visit (INDEPENDENT_AMBULATORY_CARE_PROVIDER_SITE_OTHER): Payer: BLUE CROSS/BLUE SHIELD | Admitting: *Deleted

## 2018-04-12 DIAGNOSIS — J4550 Severe persistent asthma, uncomplicated: Secondary | ICD-10-CM

## 2018-04-19 ENCOUNTER — Other Ambulatory Visit: Payer: Self-pay | Admitting: Allergy and Immunology

## 2018-04-21 ENCOUNTER — Telehealth: Payer: Self-pay | Admitting: Allergy and Immunology

## 2018-04-21 MED ORDER — DEXLANSOPRAZOLE 60 MG PO CPDR: 1.0000 | DELAYED_RELEASE_CAPSULE | Freq: Every day | ORAL | 0 refills | Status: DC

## 2018-04-21 MED ORDER — MOMETASONE FURO-FORMOTEROL FUM 200-5 MCG/ACT IN AERO: 2.0000 | INHALATION_SPRAY | Freq: Two times a day (BID) | RESPIRATORY_TRACT | 0 refills | Status: DC

## 2018-04-21 NOTE — Telephone Encounter (Signed)
Rx sent in

## 2018-04-21 NOTE — Telephone Encounter (Signed)
Pt came to window and made appointment for 05/30/2018 but she needs refills on dulena and dexilant. [facility] [address] [phone].

## 2018-04-23 ENCOUNTER — Other Ambulatory Visit: Payer: Self-pay | Admitting: Allergy and Immunology

## 2018-04-27 ENCOUNTER — Ambulatory Visit (INDEPENDENT_AMBULATORY_CARE_PROVIDER_SITE_OTHER): Payer: BLUE CROSS/BLUE SHIELD | Admitting: *Deleted

## 2018-04-27 DIAGNOSIS — J309 Allergic rhinitis, unspecified: Secondary | ICD-10-CM

## 2018-05-05 ENCOUNTER — Ambulatory Visit (INDEPENDENT_AMBULATORY_CARE_PROVIDER_SITE_OTHER): Payer: BLUE CROSS/BLUE SHIELD

## 2018-05-05 DIAGNOSIS — J309 Allergic rhinitis, unspecified: Secondary | ICD-10-CM | POA: Diagnosis not present

## 2018-05-09 DIAGNOSIS — J4550 Severe persistent asthma, uncomplicated: Secondary | ICD-10-CM | POA: Diagnosis not present

## 2018-05-10 ENCOUNTER — Ambulatory Visit (INDEPENDENT_AMBULATORY_CARE_PROVIDER_SITE_OTHER): Payer: BLUE CROSS/BLUE SHIELD | Admitting: *Deleted

## 2018-05-10 DIAGNOSIS — J4550 Severe persistent asthma, uncomplicated: Secondary | ICD-10-CM

## 2018-05-12 ENCOUNTER — Ambulatory Visit (INDEPENDENT_AMBULATORY_CARE_PROVIDER_SITE_OTHER): Payer: BLUE CROSS/BLUE SHIELD | Admitting: *Deleted

## 2018-05-12 DIAGNOSIS — J309 Allergic rhinitis, unspecified: Secondary | ICD-10-CM

## 2018-05-19 ENCOUNTER — Ambulatory Visit (INDEPENDENT_AMBULATORY_CARE_PROVIDER_SITE_OTHER): Payer: BLUE CROSS/BLUE SHIELD | Admitting: *Deleted

## 2018-05-19 DIAGNOSIS — J309 Allergic rhinitis, unspecified: Secondary | ICD-10-CM | POA: Diagnosis not present

## 2018-05-22 ENCOUNTER — Other Ambulatory Visit: Payer: Self-pay | Admitting: Allergy and Immunology

## 2018-05-26 ENCOUNTER — Ambulatory Visit (INDEPENDENT_AMBULATORY_CARE_PROVIDER_SITE_OTHER): Payer: BLUE CROSS/BLUE SHIELD | Admitting: *Deleted

## 2018-05-26 ENCOUNTER — Other Ambulatory Visit: Payer: Self-pay | Admitting: Allergy and Immunology

## 2018-05-26 DIAGNOSIS — J309 Allergic rhinitis, unspecified: Secondary | ICD-10-CM

## 2018-05-30 ENCOUNTER — Encounter: Payer: Self-pay | Admitting: Allergy and Immunology

## 2018-05-30 ENCOUNTER — Ambulatory Visit: Payer: BLUE CROSS/BLUE SHIELD | Admitting: Allergy and Immunology

## 2018-05-30 VITALS — BP 106/70 | HR 72 | Resp 16

## 2018-05-30 DIAGNOSIS — K219 Gastro-esophageal reflux disease without esophagitis: Secondary | ICD-10-CM

## 2018-05-30 DIAGNOSIS — J4550 Severe persistent asthma, uncomplicated: Secondary | ICD-10-CM

## 2018-05-30 DIAGNOSIS — J3089 Other allergic rhinitis: Secondary | ICD-10-CM

## 2018-05-30 DIAGNOSIS — Z91018 Allergy to other foods: Secondary | ICD-10-CM | POA: Diagnosis not present

## 2018-05-30 NOTE — Progress Notes (Signed)
Follow-up Note  Referring Provider: Loyola Mast, MD Primary Provider: Loyola Mast, MD Date of Office Visit: 05/30/2018  Subjective:   Elizabeth Booker (DOB: Dec 01, 1995) is a 22 y.o. female [facility] in re-evaluation of the following:  HPI: Elizabeth Booker returns to this clinic in reevaluation of asthma and allergic rhinitis and reflux and food allergy directed against several fruits and onion.  I have not seen her in this clinic since 29 September 2017.  Her asthma is under excellent control.  She has not required a systemic steroid or antibiotic since her last visit in this clinic.  Over the course of the past several days she did develop rhinitis and it is dropped down into her chest and especially with cold air exposure she has developed a little bit of intermittent shortness of breath and slight cough and she activated her action plan 2 days ago and is doing relatively well.  Otherwise, she rarely uses a short acting bronchodilator and can exercise without any problem.  She continues to use Dulera and nasal fluticasone and montelukast on a regular basis.  She also continues on mepolizumab every month and immunotherapy currently at every week.  Reflux has been under excellent control on a combination of therapy including a proton pump inhibitor and H2 receptor blocker.  She did obtain a flu vaccine.  Allergies as of 05/30/2018      Reactions   Cucumber Extract Anaphylaxis   Mango Flavor Anaphylaxis   fruit   Onion Anaphylaxis   raw   Other Anaphylaxis   Cantaloupe, most vegetables and fruits   Watermelon Concentrate [citrullus Vulgaris] Anaphylaxis   Iohexol Swelling   Throat swelling oral , per Dr. Fredirick Lathe per Pre meds with IV contrast or do scan without //rls      Medication List      azelastine 0.1 % nasal spray Commonly known as:  ASTELIN USE 2 SPRAY(S) IN EACH NOSTRIL TWICE DAILY   beclomethasone 80 MCG/ACT  inhaler Commonly known as:  QVAR Inhale 2 puffs into the lungs 2 (two) times daily. With asthma flareup   dexlansoprazole 60 MG capsule Commonly known as:  DEXILANT Take 1 capsule (60 mg total) by mouth daily.   EPINEPHrine 0.3 mg/0.3 mL Soaj injection Commonly known as:  EPI-PEN INJECT 0.3ML INTRAMUSCULARLY AS DIRECTED   fluticasone 50 MCG/ACT nasal spray Commonly known as:  FLONASE Place 2 sprays into both nostrils as needed.   ibuprofen 200 MG tablet Commonly known as:  ADVIL,MOTRIN Take 400 mg by mouth every 6 (six) hours as needed. For pain   ipratropium-albuterol 0.5-2.5 (3) MG/3ML Soln Commonly known as:  DUONEB USE 1 VIAL IN NEBULIZER EVERY 6 HOURS AS NEEDED   levocetirizine 5 MG tablet Commonly known as:  XYZAL Take 5 mg by mouth every evening.   levocetirizine 5 MG tablet Commonly known as:  XYZAL TAKE 1 TABLET BY MOUTH ONCE DAILY IN THE EVENING   mometasone-formoterol 200-5 MCG/ACT Aero Commonly known as:  DULERA Inhale 2 puffs into the lungs 2 (two) times daily.   montelukast 10 MG tablet Commonly known as:  SINGULAIR TAKE 1 TABLET BY MOUTH ONCE DAILY AS DIRECTED   NUCALA 100 MG Solr Generic drug:  Mepolizumab INJECT 100MG  SUBCUTANEOUSLY EVERY 4 WEEKS.   NUVARING 0.12-0.015 MG/24HR vaginal ring Generic drug:  etonogestrel-ethinyl estradiol   PATANOL OP Apply to eye. Uses PRN   PROAIR HFA 108 (90 Base) MCG/ACT inhaler Generic drug:  albuterol INHALE 2 PUFFS BY MOUTH EVERY 4 HOURS AS NEEDED FOR COUGH AND WHEEZING. MAY USE 2 PUFFS 10 TO 20 MINUTES PRIOR TO EXERCISE   ranitidine 300 MG tablet Commonly known as:  ZANTAC TAKE 1 TABLET BY MOUTH AT BEDTIME       Past Medical History:  Diagnosis Date  . Allergy   . Asthma   . GERD (gastroesophageal reflux disease)   . Kidney infection     Past Surgical History:  Procedure Laterality Date  . ESOPHAGEAL MANOMETRY N/A 12/15/2016   Procedure: ESOPHAGEAL MANOMETRY (EM);  Surgeon: Iva Boop,  MD;  Location: [facility];  Service: Endoscopy;  Laterality: N/A;  . ESOPHAGOGASTRODUODENOSCOPY (EGD) WITH ESOPHAGEAL DILATION  08/30/2016  . MOUTH SURGERY    . PH IMPEDANCE STUDY N/A 12/15/2016   Procedure: PH IMPEDANCE STUDY;  Surgeon: Iva Boop, MD;  Location: [facility];  Service: Endoscopy;  Laterality: N/A;    Review of systems negative except as noted in HPI / PMHx or noted below:  Review of Systems  Constitutional: Negative.   HENT: Negative.   Eyes: Negative.   Respiratory: Negative.   Cardiovascular: Negative.   Gastrointestinal: Negative.   Genitourinary: Negative.   Musculoskeletal: Negative.   Skin: Negative.   Neurological: Negative.   Endo/Heme/Allergies: Negative.   Psychiatric/Behavioral: Negative.      Objective:   Vitals:   05/30/18 1548  BP: 106/70  Pulse: 72  Resp: 16          Physical Exam  HENT:  Head: Normocephalic.  Right Ear: Tympanic membrane, external ear and ear canal normal.  Left Ear: Tympanic membrane, external ear and ear canal normal.  Nose: Nose normal. No mucosal edema or rhinorrhea.  Mouth/Throat: Uvula is midline, oropharynx is clear and moist and mucous membranes are normal. No oropharyngeal exudate.  Eyes: Conjunctivae are normal.  Neck: Trachea normal. No tracheal tenderness present. No tracheal deviation present. No thyromegaly present.  Cardiovascular: Normal rate, regular rhythm, S1 normal, S2 normal and normal heart sounds.  No murmur heard. Pulmonary/Chest: Breath sounds normal. No stridor. No respiratory distress. She has no wheezes. She has no rales.  Musculoskeletal: She exhibits no edema.  Lymphadenopathy:       Head (right side): No tonsillar adenopathy present.       Head (left side): No tonsillar adenopathy present.    She has no cervical adenopathy.  Neurological: She is alert.  Skin: No rash noted. She is not diaphoretic. No erythema. Nails show no clubbing.    Diagnostics:    Spirometry was  performed and demonstrated an FEV1 of 3.17 at 99 % of predicted.  The patient had an Asthma Control Test with the following results: ACT Total Score: 18.    Assessment and Plan:   1. Asthma, severe persistent, well-controlled   2. Other allergic rhinitis   3. Food allergy   4. Gastroesophageal reflux disease, esophagitis presence not specified      1. Continue to Treat reflux:   A. Dexilant 60 mg one tablet in a.m.  B. Ranitidine 300mg  one tablet in PM  2. Continue Dulera 200 - 2 inhalations 1-2 times a day  3. Add in Qvar 80 Redihaler - 2 inhalations twice a day to Dulera 200 - 2 inhalations twice a day during "flareup"  4. Continue nasal fluticasone 1-2 sprays each nostril 1 time per day  5. Continue montelukast 10 mg daily   6. Continue antihistamine and albuterol MDI if needed  7. Continue  immunotherapy and EpiPen  8. Continue Nucala injections  9.  Return to clinic in 6 months or earlier if problem  Elizabeth Booker has really done very well on his current medical therapy and she will continue to utilize a combination of anti-inflammatory agents for her airway including the use of mepolizumab and immunotherapy and she will also continue to treat reflux and I will see her back in this clinic in 6 months or earlier if there is a problem.  Elizabeth Booker , MD Allergy / Immunology [facility]

## 2018-05-30 NOTE — Patient Instructions (Addendum)
  1. Continue to Treat reflux:   A. Dexilant 60 mg one tablet in a.m.  B. Ranitidine 300mg  one tablet in PM  2. Continue Dulera 200 - 2 inhalations 1-2 times a day  3. Add in Qvar 80 Redihaler - 2 inhalations twice a day to Dulera 200 - 2 inhalations twice a day during "flareup"  4. Continue nasal fluticasone 1-2 sprays each nostril 1 time per day  5. Continue montelukast 10 mg daily   6. Continue antihistamine and albuterol MDI if needed  7. Continue immunotherapy and EpiPen  8. Continue Nucala injections  9.  Return to clinic in 6 months or earlier if problem

## 2018-05-31 ENCOUNTER — Encounter: Payer: Self-pay | Admitting: Allergy and Immunology

## 2018-05-31 ENCOUNTER — Other Ambulatory Visit: Payer: Self-pay | Admitting: Allergy and Immunology

## 2018-06-02 ENCOUNTER — Ambulatory Visit (INDEPENDENT_AMBULATORY_CARE_PROVIDER_SITE_OTHER): Payer: BLUE CROSS/BLUE SHIELD

## 2018-06-02 DIAGNOSIS — J309 Allergic rhinitis, unspecified: Secondary | ICD-10-CM | POA: Diagnosis not present

## 2018-06-12 ENCOUNTER — Ambulatory Visit: Payer: Self-pay

## 2018-06-16 ENCOUNTER — Ambulatory Visit (INDEPENDENT_AMBULATORY_CARE_PROVIDER_SITE_OTHER): Payer: BLUE CROSS/BLUE SHIELD | Admitting: *Deleted

## 2018-06-16 DIAGNOSIS — J309 Allergic rhinitis, unspecified: Secondary | ICD-10-CM | POA: Diagnosis not present

## 2018-06-19 DIAGNOSIS — J4550 Severe persistent asthma, uncomplicated: Secondary | ICD-10-CM | POA: Diagnosis not present

## 2018-06-20 ENCOUNTER — Ambulatory Visit (INDEPENDENT_AMBULATORY_CARE_PROVIDER_SITE_OTHER): Payer: BLUE CROSS/BLUE SHIELD | Admitting: *Deleted

## 2018-06-20 DIAGNOSIS — J4550 Severe persistent asthma, uncomplicated: Secondary | ICD-10-CM

## 2018-07-07 ENCOUNTER — Other Ambulatory Visit: Payer: Self-pay | Admitting: Allergy and Immunology

## 2018-07-07 ENCOUNTER — Ambulatory Visit (INDEPENDENT_AMBULATORY_CARE_PROVIDER_SITE_OTHER): Payer: BLUE CROSS/BLUE SHIELD

## 2018-07-07 DIAGNOSIS — J309 Allergic rhinitis, unspecified: Secondary | ICD-10-CM | POA: Diagnosis not present

## 2018-07-10 ENCOUNTER — Other Ambulatory Visit: Payer: Self-pay | Admitting: Allergy and Immunology

## 2018-07-10 DIAGNOSIS — J301 Allergic rhinitis due to pollen: Secondary | ICD-10-CM | POA: Diagnosis not present

## 2018-07-10 NOTE — Progress Notes (Signed)
VIALS EXP 07-01-19

## 2018-07-14 ENCOUNTER — Ambulatory Visit (INDEPENDENT_AMBULATORY_CARE_PROVIDER_SITE_OTHER): Payer: BLUE CROSS/BLUE SHIELD | Admitting: *Deleted

## 2018-07-14 DIAGNOSIS — J309 Allergic rhinitis, unspecified: Secondary | ICD-10-CM | POA: Diagnosis not present

## 2018-07-18 ENCOUNTER — Ambulatory Visit: Payer: BLUE CROSS/BLUE SHIELD

## 2018-07-20 ENCOUNTER — Ambulatory Visit (INDEPENDENT_AMBULATORY_CARE_PROVIDER_SITE_OTHER): Payer: BLUE CROSS/BLUE SHIELD | Admitting: *Deleted

## 2018-07-20 DIAGNOSIS — J309 Allergic rhinitis, unspecified: Secondary | ICD-10-CM | POA: Diagnosis not present

## 2018-07-26 ENCOUNTER — Telehealth: Payer: Self-pay | Admitting: Allergy and Immunology

## 2018-07-26 ENCOUNTER — Ambulatory Visit (INDEPENDENT_AMBULATORY_CARE_PROVIDER_SITE_OTHER): Payer: BLUE CROSS/BLUE SHIELD | Admitting: *Deleted

## 2018-07-26 DIAGNOSIS — J309 Allergic rhinitis, unspecified: Secondary | ICD-10-CM

## 2018-07-26 MED ORDER — OSELTAMIVIR PHOSPHATE 75 MG PO CAPS: 75.0000 mg | ORAL_CAPSULE | Freq: Every day | ORAL | 0 refills | Status: DC

## 2018-07-26 NOTE — Telephone Encounter (Signed)
Rx was sent over to pharmacy and contacted patient and left message on machine.

## 2018-07-26 NOTE — Telephone Encounter (Signed)
Please inform patient that she can use a preventative and prophylactic influenza treatment using Tamiflu 75 mg 1 time per day for 10 days.

## 2018-07-26 NOTE — Telephone Encounter (Signed)
Pt called and said that she has been around people with the flu and took one of her friends to the dr yesterday and was told that she had the flu . So that dr told her contact her to see if they would call in a rx of tamaflu. [facility] [address] [phone].

## 2018-07-26 NOTE — Telephone Encounter (Signed)
Spoke with patient and ask if she has any sx. Patient states she is asymptomatic and was told by another doctor she needed to be on a half dose. Patient states that Dr. Lucie Leather always refills this medication for her. Please advise on tamaflu medication

## 2018-08-05 ENCOUNTER — Other Ambulatory Visit: Payer: Self-pay | Admitting: Allergy and Immunology

## 2018-08-07 ENCOUNTER — Other Ambulatory Visit: Payer: Self-pay | Admitting: Allergy and Immunology

## 2018-08-10 ENCOUNTER — Telehealth: Payer: Self-pay

## 2018-08-10 NOTE — Telephone Encounter (Signed)
Patient has been set up for Feb 17 at 10:20 with Dr Fransico Settersraister. Allergy test , last nucala, last allergy injection & Last OV sent to their office.   [phone]#[phone]  [address] [place], [place] [id number]   I called the patient to inform her and she wanted me to call the mom. I informed mom and she will call their office to see if they can get in sooner. She is always wanting to come in for an allergy injection on Friday and to pick up vials. I spoke with heather and she said this is okay. Mom understands it will not be covered by insurance.

## 2018-08-10 NOTE — Telephone Encounter (Signed)
-----   Message from Devoria Glassing, [place] sent at 08/09/2018  3:48 PM EST ----- Can you refer patient to Dr Fransico Setters in [facility] for her asthma/Nucala injections. She has [employer] now. Thanks, [employer]

## 2018-08-29 ENCOUNTER — Ambulatory Visit (INDEPENDENT_AMBULATORY_CARE_PROVIDER_SITE_OTHER): Payer: Self-pay | Admitting: *Deleted

## 2018-08-29 DIAGNOSIS — J309 Allergic rhinitis, unspecified: Secondary | ICD-10-CM

## 2018-08-31 ENCOUNTER — Other Ambulatory Visit: Payer: Self-pay | Admitting: Allergy and Immunology

## 2018-09-13 ENCOUNTER — Ambulatory Visit (INDEPENDENT_AMBULATORY_CARE_PROVIDER_SITE_OTHER): Payer: Self-pay | Admitting: *Deleted

## 2018-09-13 DIAGNOSIS — J309 Allergic rhinitis, unspecified: Secondary | ICD-10-CM

## 2018-09-20 ENCOUNTER — Ambulatory Visit (INDEPENDENT_AMBULATORY_CARE_PROVIDER_SITE_OTHER): Payer: Self-pay | Admitting: *Deleted

## 2018-09-20 DIAGNOSIS — J309 Allergic rhinitis, unspecified: Secondary | ICD-10-CM

## 2018-10-12 IMAGING — DX DG CHEST 2V
2 series · 2 of 2 positions shown · non-contrast
Comparison: Radiographs January 11, 2016.

CLINICAL DATA: Chest pain.

EXAM:
CHEST  2 VIEW

[w chest pa]
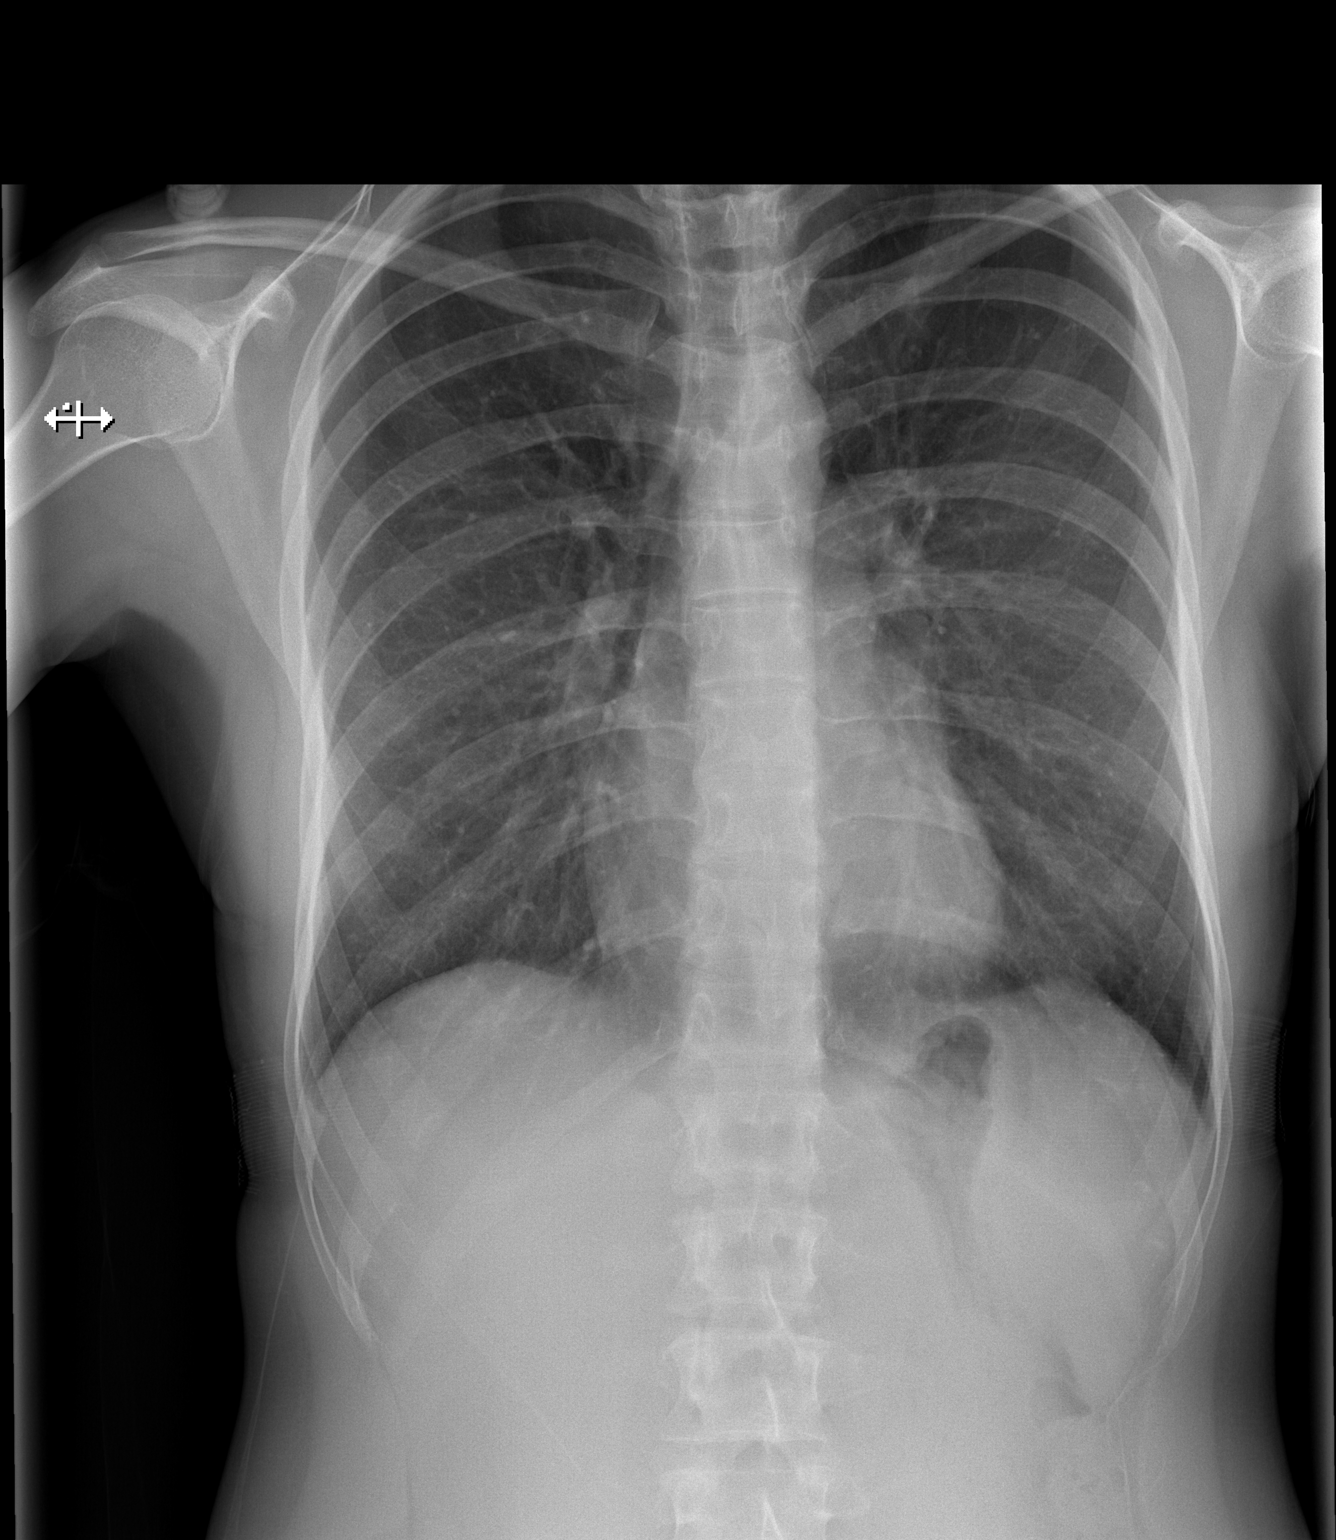

[w chest lat]
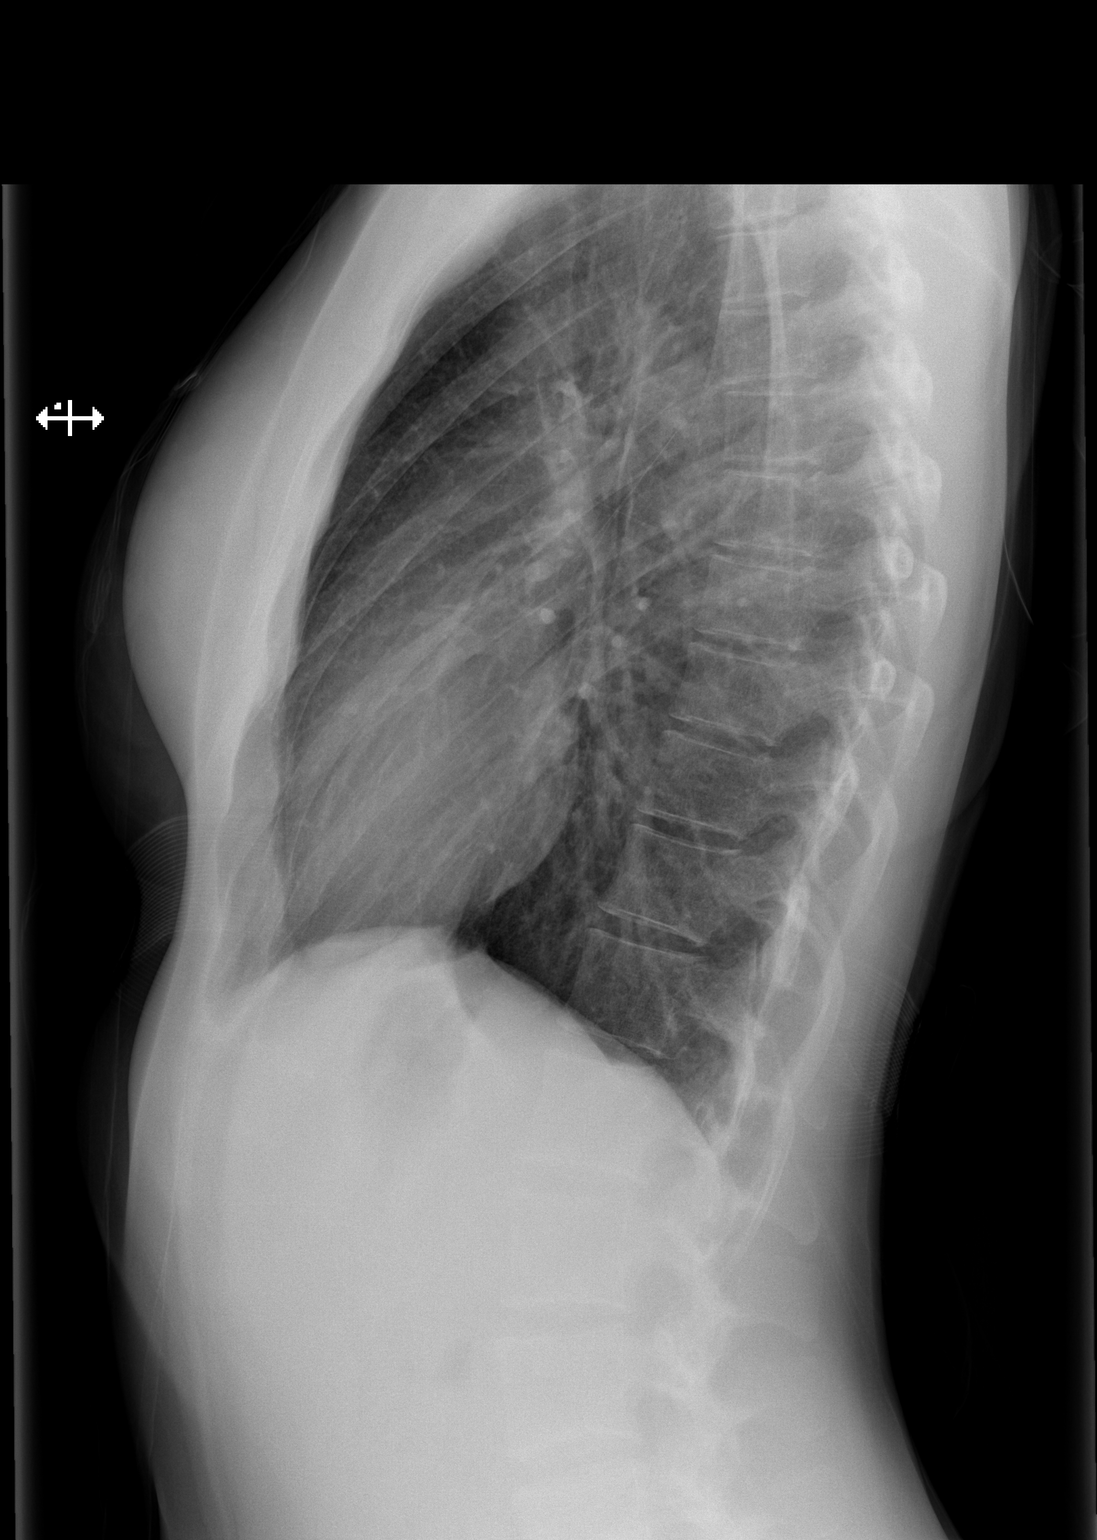

[2 of 2 positions shown; findings below may reference images not displayed]

FINDINGS: The heart size and mediastinal contours are within normal limits.
Both lungs are clear. No pneumothorax or pleural effusion is noted.
The visualized skeletal structures are unremarkable.
IMPRESSION: No active cardiopulmonary disease.

## 2018-12-15 ENCOUNTER — Other Ambulatory Visit: Payer: Self-pay | Admitting: Allergy and Immunology

## 2018-12-18 ENCOUNTER — Other Ambulatory Visit: Payer: Self-pay | Admitting: Allergy and Immunology

## 2018-12-19 ENCOUNTER — Ambulatory Visit: Payer: BLUE CROSS/BLUE SHIELD | Admitting: Allergy and Immunology

## 2019-01-07 ENCOUNTER — Other Ambulatory Visit: Payer: Self-pay | Admitting: Allergy and Immunology

## 2019-01-21 ENCOUNTER — Other Ambulatory Visit: Payer: Self-pay | Admitting: Allergy and Immunology

## 2019-02-01 ENCOUNTER — Other Ambulatory Visit: Payer: Self-pay

## 2019-02-01 ENCOUNTER — Ambulatory Visit: Payer: Self-pay | Admitting: Allergy and Immunology

## 2019-02-01 ENCOUNTER — Encounter: Payer: Self-pay | Admitting: Allergy and Immunology

## 2019-02-01 DIAGNOSIS — H1010 Acute atopic conjunctivitis, unspecified eye: Secondary | ICD-10-CM

## 2019-02-01 DIAGNOSIS — Z91018 Allergy to other foods: Secondary | ICD-10-CM

## 2019-02-01 DIAGNOSIS — J4550 Severe persistent asthma, uncomplicated: Secondary | ICD-10-CM

## 2019-02-01 DIAGNOSIS — K219 Gastro-esophageal reflux disease without esophagitis: Secondary | ICD-10-CM

## 2019-02-01 DIAGNOSIS — J3089 Other allergic rhinitis: Secondary | ICD-10-CM

## 2019-02-01 NOTE — Progress Notes (Signed)
[facility] [place] [facility] [place]   Follow-up Note  Referring Provider: Lennie Hummer, MD Primary Provider: Patient, No Pcp Per Date of Office Visit: 02/01/2019  Subjective:   Elizabeth Booker (DOB: 02/19/1996) is a 23 y.o. female who returns to the [facility] on 02/01/2019 in re-evaluation of the following:  HPI: This is a E-med visit requested by patient who is located at home.  Elizabeth Booker is followed in this clinic for asthma and allergic rhinitis and reflux and food allergy directed against fruits and onion.  Her last visit to this clinic was 30 May 2018.  Asthma doing well. No need for systemic steroids. Discontinued mepolizumab January 2020. Using [facility] most 1 time per day. Rarely use a SABA. Can exercise.   Nose doing well. No need for fluticasone. Still using singulair.   Can eat many foods lately including melons, lettuce, carrots, onions, bananas without problem.   Completed immunotherapy this spring.   Reflux OK with Dexilant. If eat peanut butter get chest pain. Minimal caffeine consumption.  Has had some issues with tonsilliths and is considering having surgery to remove her tonsils.  Allergies as of 02/01/2019      Reactions   Cucumber Extract Anaphylaxis   Mango Flavor Anaphylaxis   fruit   Onion Anaphylaxis   raw   Other Anaphylaxis   Cantaloupe, most vegetables and fruits   Watermelon Concentrate [citrullus Vulgaris] Anaphylaxis   Iohexol Swelling   Throat swelling oral , per Dr. Rosario Jacks per Pre meds with IV contrast or do scan without //rls      Medication List      Dexilant 60 MG capsule Generic drug: dexlansoprazole Take 1 capsule by mouth once daily   Dulera 200-5 MCG/ACT Aero Generic drug: mometasone-formoterol INHALE 2 PUFFS BY MOUTH TWICE DAILY   EPINEPHrine 0.3 mg/0.3 mL Soaj injection Commonly known as: EPI-PEN INJECT 0.3MLS INTO THE MUSCLE AS DIRECTED   fluticasone 50 MCG/ACT nasal spray  Commonly known as: FLONASE Place 2 sprays into both nostrils as needed.   ibuprofen 200 MG tablet Commonly known as: ADVIL Take 400 mg by mouth every 6 (six) hours as needed. For pain   ipratropium-albuterol 0.5-2.5 (3) MG/3ML Soln Commonly known as: DUONEB USE 1 VIAL IN NEBULIZER EVERY 6 HOURS AS NEEDED   levocetirizine 5 MG tablet Commonly known as: XYZAL TAKE 1 TABLET BY MOUTH ONCE DAILY IN THE EVENING   montelukast 10 MG tablet Commonly known as: SINGULAIR TAKE 1 TABLET BY MOUTH ONCE DAILY AS DIRECTED   PATANOL OP Apply to eye. Uses PRN   ProAir HFA 108 (90 Base) MCG/ACT inhaler Generic drug: albuterol INHALE 2 PUFFS BY MOUTH EVERY 4 HOURS AS NEEDED FOR COUGH AND WHEEZING. MAY USE 2 PUFFS 10 TO 20 MINUTES PRIOR TO EXERCISE   Qvar RediHaler 80 MCG/ACT inhaler Generic drug: beclomethasone INHALE 2 PUFFS BY MOUTH TWICE DAILY WITH ASTHMA FLAREUP       Past Medical History:  Diagnosis Date  . Allergy   . Asthma   . GERD (gastroesophageal reflux disease)   . Kidney infection     Past Surgical History:  Procedure Laterality Date  . ESOPHAGEAL MANOMETRY N/A 12/15/2016   Procedure: ESOPHAGEAL MANOMETRY (EM);  Surgeon: Gatha Mayer, MD;  Location: [facility];  Service: Endoscopy;  Laterality: N/A;  . ESOPHAGOGASTRODUODENOSCOPY (EGD) WITH ESOPHAGEAL DILATION  08/30/2016  . MOUTH SURGERY    . [place] IMPEDANCE STUDY N/A 12/15/2016   Procedure: PH IMPEDANCE  STUDY;  Surgeon: Iva BoopGessner, Carl E, MD;  [facility];  Service: Endoscopy;  Laterality: N/A;    Review of systems negative except as noted in HPI / PMHx or noted below:  Review of Systems  Constitutional: Negative.   HENT: Negative.   Eyes: Negative.   Respiratory: Negative.   Cardiovascular: Negative.   Gastrointestinal: Negative.   Genitourinary: Negative.   Musculoskeletal: Negative.   Skin: Negative.   Neurological: Negative.   Endo/Heme/Allergies: Negative.   Psychiatric/Behavioral: Negative.       Objective:   There were no vitals filed for this visit.        Physical Exam-deferred  Diagnostics: none  Assessment and Plan:   1. Asthma, severe persistent, well-controlled   2. Other allergic rhinitis   3. Food allergy   4. Gastroesophageal reflux disease, esophagitis presence not specified   5. Seasonal allergic conjunctivitis      1. Continue to Treat reflux with Dexilant 60 mg one tablet in a.m.  2. Continue Dulera 200 - 2 inhalations 1-2 times a day depending on disease activity  3. Add in Qvar 80 Redihaler - 2 inhalations twice a day to Dulera 200 - 2 inhalations twice a day during "flareup"  4. Continue nasal fluticasone 1-2 sprays each nostril 1-7 times a week depending on disease activity  5. Continue montelukast 10 mg daily   6. Continue levo cetirizine, Patanol, and albuterol MDI if needed  7.  Return to clinic in 6 months or earlier if problem  8.  Obtain full flu vaccine (and COVID vaccine)  Elizabeth Booker appears to be doing very well and we are going to continue to have her use relatively low dose anti-inflammatory agents for her airway and continue to treat reflux.  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  Assuming she continues to do well on this plan she can follow-up in this clinic in 6 months or earlier if there is a problem.  Total patient interaction time 20 minutes.  Laurette SchimkeEric Kozlow, MD Allergy / Immunology [facility]

## 2019-02-01 NOTE — Patient Instructions (Addendum)
  1. Continue to Treat reflux with Dexilant 60 mg one tablet in a.m.  2. Continue Dulera 200 - 2 inhalations 1-2 times a day depending on disease activity  3. Add in Qvar 80 Redihaler - 2 inhalations twice a day to Dulera 200 - 2 inhalations twice a day during "flareup"  4. Continue nasal fluticasone 1-2 sprays each nostril 1-7 times a week depending on disease activity  5. Continue montelukast 10 mg daily   6. Continue levo cetirizine, Patanol, and albuterol MDI if needed  7.  Return to clinic in 6 months or earlier if problem  8.  Obtain full flu vaccine (and COVID vaccine)

## 2019-02-02 ENCOUNTER — Other Ambulatory Visit: Payer: Self-pay | Admitting: Allergy and Immunology

## 2019-02-05 ENCOUNTER — Encounter: Payer: Self-pay | Admitting: Allergy and Immunology

## 2019-04-29 ENCOUNTER — Other Ambulatory Visit: Payer: Self-pay | Admitting: Allergy and Immunology

## 2019-06-14 ENCOUNTER — Other Ambulatory Visit: Payer: Self-pay

## 2019-06-14 DIAGNOSIS — Z20822 Contact with and (suspected) exposure to covid-19: Secondary | ICD-10-CM

## 2019-06-15 LAB — NOVEL CORONAVIRUS, NAA: SARS-CoV-2, NAA: NOT DETECTED

## 2019-07-07 ENCOUNTER — Other Ambulatory Visit: Payer: Self-pay | Admitting: Allergy and Immunology

## 2019-07-11 ENCOUNTER — Other Ambulatory Visit: Payer: Self-pay | Admitting: *Deleted

## 2019-07-11 ENCOUNTER — Telehealth: Payer: Self-pay | Admitting: Allergy and Immunology

## 2019-07-11 ENCOUNTER — Telehealth: Payer: Self-pay | Admitting: *Deleted

## 2019-07-11 MED ORDER — PREDNISONE 10 MG PO TABS: ORAL_TABLET | ORAL | 0 refills | Status: DC

## 2019-07-11 NOTE — Telephone Encounter (Signed)
Dad called in and states Elizabeth Booker was positive for COVID last night.  She is having trouble breathing and having body aches.  Dad states the doctors told him to have [name] use a nebulizer every 4 hours.  He was not satisfied with that recommendation and wanted to speak with Dr. Neldon Mc personally about how to utilize [name]'s medications to help her breathe better.  [name] would like Dr. Neldon Mc to call him at his earliest convenience [phone].  Please advise.

## 2019-07-11 NOTE — Telephone Encounter (Addendum)
-----   Message from Jiles Prows, MD sent at 07/11/2019  9:56 AM EST ----- Spoke with patient. Please call out Prednisone 10 mg daily for 5 days only. Add Qvar to [facility]. Stay well hydrated. Use ibuprofen. Use SABA if needed.    Prednisone sent to pharmacy.

## 2019-07-12 ENCOUNTER — Telehealth: Payer: Self-pay | Admitting: Allergy and Immunology

## 2019-07-12 NOTE — Telephone Encounter (Signed)
I discussed this issue with Elizabeth Booker. It sounds like she is doing better.

## 2019-07-12 NOTE — Telephone Encounter (Signed)
Perlie Mayo called and asked to speak with Dr. Neldon Mc.  Mr. Elizabeth Booker was informed that Dr. Neldon Mc was with a patient.  Mr. Elizabeth Booker requested to speak with Dr. Neldon Mc about Elizabeth Booker.  Mr. Elizabeth Booker is currently at a store and his wife called and stated Elizabeth Booker's lungs are sounding really bad.  Mr. Elizabeth Booker then got short and said he just wanted to speak to Dr. Neldon Mc so to have him give a call.  Please call Mr. Elizabeth Booker at [phone] and he states if you can't get through to call his wife at [phone].

## 2019-08-06 ENCOUNTER — Other Ambulatory Visit: Payer: Self-pay | Admitting: Allergy and Immunology

## 2019-08-07 ENCOUNTER — Other Ambulatory Visit: Payer: Self-pay

## 2019-08-07 ENCOUNTER — Encounter: Payer: Self-pay | Admitting: Allergy and Immunology

## 2019-08-07 ENCOUNTER — Ambulatory Visit (INDEPENDENT_AMBULATORY_CARE_PROVIDER_SITE_OTHER): Payer: 59 | Admitting: Allergy and Immunology

## 2019-08-07 VITALS — BP 108/64 | HR 91 | Temp 97.7°F | Resp 16

## 2019-08-07 DIAGNOSIS — Z91018 Allergy to other foods: Secondary | ICD-10-CM | POA: Diagnosis not present

## 2019-08-07 DIAGNOSIS — J4550 Severe persistent asthma, uncomplicated: Secondary | ICD-10-CM | POA: Diagnosis not present

## 2019-08-07 DIAGNOSIS — J3089 Other allergic rhinitis: Secondary | ICD-10-CM | POA: Diagnosis not present

## 2019-08-07 MED ORDER — MONTELUKAST SODIUM 10 MG PO TABS: 10.0000 mg | ORAL_TABLET | Freq: Every day | ORAL | 5 refills | Status: DC

## 2019-08-07 MED ORDER — DULERA 200-5 MCG/ACT IN AERO: 2.0000 | INHALATION_SPRAY | Freq: Two times a day (BID) | RESPIRATORY_TRACT | 5 refills | Status: DC

## 2019-08-07 MED ORDER — DEXILANT 60 MG PO CPDR: 1.0000 | DELAYED_RELEASE_CAPSULE | Freq: Every day | ORAL | 5 refills | Status: DC

## 2019-08-07 MED ORDER — PROAIR HFA 108 (90 BASE) MCG/ACT IN AERS: INHALATION_SPRAY | RESPIRATORY_TRACT | 1 refills | Status: DC

## 2019-08-07 MED ORDER — FLUTICASONE PROPIONATE 50 MCG/ACT NA SUSP: 1.0000 | NASAL | 5 refills | Status: DC | PRN

## 2019-08-07 NOTE — Progress Notes (Signed)
[place] - [place] - [place] - [place] - [place]   Follow-up Note  Referring Provider: No ref. provider found Primary Provider: Patient, No Pcp Per Date of Office Visit: 08/07/2019  Subjective:   Elizabeth Booker (DOB: 09-06-1995) is a 24 y.o. female who [facility] in re-evaluation of the following:  HPI: Nigel presents to this clinic in evaluation of asthma and allergic rhinitis and reflux and food allergy directed against fruits and onions.  Her last contact with this clinic was a E - med visit on 01 February 2019 although we have had several telephone contacts regarding her recent Covid infection between Christmas and New Year's.  She completely resolved all symptomatology regarding her Covid infection and was doing quite well until last week.  At that point time she developed a sore throat and postnasal drip and stuffy nose and head pressure.  Over the course of the subsequent 48 hours most of that issue has resolved but her infection dropped down to her chest and she has some chest tightness and feels as though there is a little gunk in her chest.  She has been using her short acting bronchodilator and she is not really sure that helps her very much.  She has also had a little bit more reflux and she has added famotidine to her Dexilant.  She continues to use [facility] on a regular basis.  Her nose has been doing well intermittently using a nasal fluticasone and montelukast on a consistent basis.  She has not been having any fever or ugly nasal discharge or ugly sputum production or significant chest pain or constitutional symptoms.  Allergies as of 08/07/2019      Reactions   Cucumber Extract Anaphylaxis   Mango Flavor Anaphylaxis   fruit   Onion Anaphylaxis   raw   Other Anaphylaxis   Cantaloupe, most vegetables and fruits   Watermelon Concentrate [citrullus Vulgaris] Anaphylaxis   Iohexol Swelling   Throat swelling oral , per  Dr. Fredirick Lathe per Pre meds with IV contrast or do scan without //rls      Medication List    Dexilant 60 MG capsule Generic drug: dexlansoprazole Take 1 capsule by mouth once daily   Dulera 200-5 MCG/ACT Aero Generic drug: mometasone-formoterol INHALE 2 PUFFS BY MOUTH TWICE DAILY   EPINEPHrine 0.3 mg/0.3 mL Soaj injection Commonly known as: EPI-PEN INJECT CONTENTS OF 1 PEN AS NEEDED FOR LIFE-THREATENING ALLERGIC REACTION   fluticasone 50 MCG/ACT nasal spray Commonly known as: FLONASE Place 2 sprays into both nostrils as needed.   ibuprofen 200 MG tablet Commonly known as: ADVIL Take 400 mg by mouth every 6 (six) hours as needed. For pain   ipratropium-albuterol 0.5-2.5 (3) MG/3ML Soln Commonly known as: DUONEB USE 1 VIAL IN NEBULIZER EVERY 6 HOURS AS NEEDED   levocetirizine 5 MG tablet Commonly known as: XYZAL TAKE 1 TABLET BY MOUTH ONCE DAILY AS NEEDED   montelukast 10 MG tablet Commonly known as: SINGULAIR TAKE 1 TABLET BY MOUTH ONCE DAILY AS DIRECTED   PATANOL OP Apply to eye. Uses PRN   ProAir HFA 108 (90 Base) MCG/ACT inhaler Generic drug: albuterol INHALE 2 PUFFS BY MOUTH EVERY 4 HOURS AS NEEDED FOR WHEEZING OR SHORTNESS OF BREATH   Qvar RediHaler 80 MCG/ACT inhaler Generic drug: beclomethasone INHALE 2 PUFFS BY MOUTH TWICE DAILY WITH ASTHMA FLAREUP       Past Medical History:  Diagnosis Date  . Allergy   .  Asthma   . GERD (gastroesophageal reflux disease)   . Kidney infection     Past Surgical History:  Procedure Laterality Date  . ESOPHAGEAL MANOMETRY N/A 12/15/2016   Procedure: ESOPHAGEAL MANOMETRY (EM);  Surgeon: Gatha Mayer, MD;  Location: [facility];  Service: Endoscopy;  Laterality: N/A;  . ESOPHAGOGASTRODUODENOSCOPY (EGD) WITH ESOPHAGEAL DILATION  08/30/2016  . MOUTH SURGERY    . [place] IMPEDANCE STUDY N/A 12/15/2016   Procedure: [place] IMPEDANCE STUDY;  Surgeon: Gatha Mayer, MD;  Location: [facility];  Service: Endoscopy;  Laterality: N/A;     Review of systems negative except as noted in HPI / PMHx or noted below:  Review of Systems  Constitutional: Negative.   HENT: Negative.   Eyes: Negative.   Respiratory: Negative.   Cardiovascular: Negative.   Gastrointestinal: Negative.   Genitourinary: Negative.   Musculoskeletal: Negative.   Skin: Negative.   Neurological: Negative.   Endo/Heme/Allergies: Negative.   Psychiatric/Behavioral: Negative.      Objective:   Vitals:   08/07/19 1503  BP: 108/64  Pulse: 91  Resp: 16  Temp: 97.7 F (36.5 C)  SpO2: 97%          Physical Exam Constitutional:      Appearance: She is not diaphoretic.  HENT:     Head: Normocephalic.     Right Ear: Tympanic membrane, ear canal and external ear normal.     Left Ear: Tympanic membrane, ear canal and external ear normal.     Nose: Nose normal. No mucosal edema or rhinorrhea.     Mouth/Throat:     Pharynx: Uvula midline. No oropharyngeal exudate.  Eyes:     Conjunctiva/sclera: Conjunctivae normal.  Neck:     Thyroid: No thyromegaly.     Trachea: Trachea normal. No tracheal tenderness or tracheal deviation.  Cardiovascular:     Rate and Rhythm: Normal rate and regular rhythm.     Heart sounds: Normal heart sounds, S1 normal and S2 normal. No murmur.  Pulmonary:     Effort: No respiratory distress.     Breath sounds: Normal breath sounds. No stridor. No wheezing or rales.  Lymphadenopathy:     Head:     Right side of head: No tonsillar adenopathy.     Left side of head: No tonsillar adenopathy.     Cervical: No cervical adenopathy.  Skin:    Findings: No erythema or rash.     Nails: There is no clubbing.  Neurological:     Mental Status: She is alert.     Diagnostics:    Spirometry was performed and demonstrated an FEV1 of 3.27 at 102 % of predicted.  The patient had an Asthma Control Test with the following results: ACT Total Score: 11.    Assessment and Plan:   1. Asthma, severe persistent,  well-controlled   2. Other allergic rhinitis   3. Food allergy      1. Continue to Treat reflux with Dexilant 60 mg one tablet in a.m. and add famotidine 40 mg tablet in p.m. if needed  2. Continue Dulera 200 - 2 inhalations 1-2 times a day depending on disease activity  3. Add sample Alvesco 160 - 1 inhalations twice a day to Dulera 200 - 2 inhalations twice a day during "flareup"  4. Continue nasal fluticasone 1-2 sprays each nostril 1-7 times a week depending on disease activity  5. Continue montelukast 10 mg daily   6. Continue levo cetirizine, Patanol, and albuterol MDI if needed  7.  Return to clinic in 6 months or earlier if problem  Sheily appears to have developed a viral respiratory tract infection and this appears to be improving regarding her upper airway and appears to have made her lower airway little a bit more inflamed.  Her airflow is perfect and I do not think her any kind of danger zone and will treat her very conservatively by just having her add in some Alvesco to her [facility] and assume that within the next week everything will resolve and then she will be back to her usual functional state which is actually been quite good as long she continues to use anti-inflammatory agents for airway and therapy directed against reflux.  Laurette Schimke, MD Allergy / Immunology [facility]

## 2019-08-07 NOTE — Patient Instructions (Addendum)
  1. Continue to Treat reflux with Dexilant 60 mg one tablet in a.m. and add famotidine 40 mg tablet in p.m. if needed  2. Continue Dulera 200 - 2 inhalations 1-2 times a day depending on disease activity  3. Add sample Alvesco 160 - 1 inhalations twice a day to Dulera 200 - 2 inhalations twice a day during "flareup"  4. Continue nasal fluticasone 1-2 sprays each nostril 1-7 times a week depending on disease activity  5. Continue montelukast 10 mg daily   6. Continue levo cetirizine, Patanol, and albuterol MDI if needed  7.  Return to clinic in 6 months or earlier if problem

## 2019-08-08 ENCOUNTER — Telehealth: Payer: Self-pay

## 2019-08-08 ENCOUNTER — Encounter: Payer: Self-pay | Admitting: Allergy and Immunology

## 2019-08-08 NOTE — Telephone Encounter (Signed)
Dexilant approved and sent to the pharmacy.

## 2019-08-08 NOTE — Telephone Encounter (Signed)
Prior authorization for Dexilant 60 mg capsules submitted via covermymeds. This is still pending at this time.

## 2019-08-13 ENCOUNTER — Other Ambulatory Visit: Payer: Self-pay | Admitting: *Deleted

## 2019-08-13 MED ORDER — ALBUTEROL SULFATE HFA 108 (90 BASE) MCG/ACT IN AERS: 2.0000 | INHALATION_SPRAY | Freq: Four times a day (QID) | RESPIRATORY_TRACT | 1 refills | Status: DC | PRN

## 2019-08-20 ENCOUNTER — Telehealth: Payer: Self-pay

## 2019-08-20 NOTE — Telephone Encounter (Signed)
Attempted to call patient. No answer. Left message.

## 2019-08-20 NOTE — Telephone Encounter (Signed)
There are 3 doses of air duo.  The mid range is 113. Use the exact same instructions used with her previous controller agent. This is a direct replacement for the previous inhaler.

## 2019-08-20 NOTE — Telephone Encounter (Signed)
[employer] called to inform us that [facility] is no longer covered. The alternatives are Earlie Server, generic Symbicort, Advair HFA/Diskus (Brand Name), Advair Diskus (generic), AirDuo generic. She did tell me that the AirDuo would be the cheapest alternative as it is tier 2.

## 2019-08-20 NOTE — Telephone Encounter (Signed)
Can you please provide specific dosing instructions for patient? Thank You.

## 2019-08-20 NOTE — Telephone Encounter (Signed)
Use the Airduo - mid-range dosing to replace Dulera. Inform patient

## 2019-08-20 NOTE — Telephone Encounter (Signed)
    There are 3 doses of air duo.  The mid range is 113. Use the exact same instructions used with her previous controller agent. This is a direct replacement for the previous inhaler.

## 2019-08-21 NOTE — Telephone Encounter (Signed)
Patient was notified of AirDuo sent to pharmacy and will replace Dulera. Patient verbalized understanding.

## 2019-09-02 IMAGING — US US ABDOMEN COMPLETE
1 series · 14 of 25 positions shown · non-contrast
Comparison: Renal ultrasound January 15, 2015

CLINICAL DATA: Abdominal pain for 3 weeks

EXAM:
ABDOMEN ULTRASOUND COMPLETE

[Series 1: us abdomen complete · 0.20mm/px · 14 of 114 slices shown]
[im 1/114]
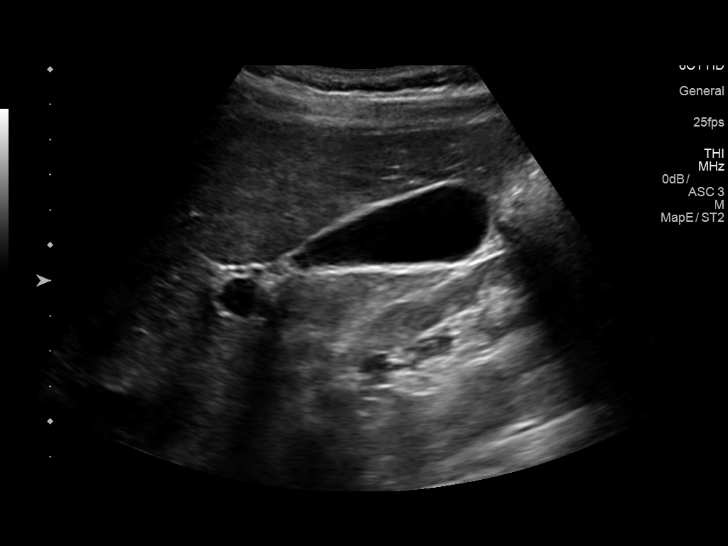
[im 10/114]
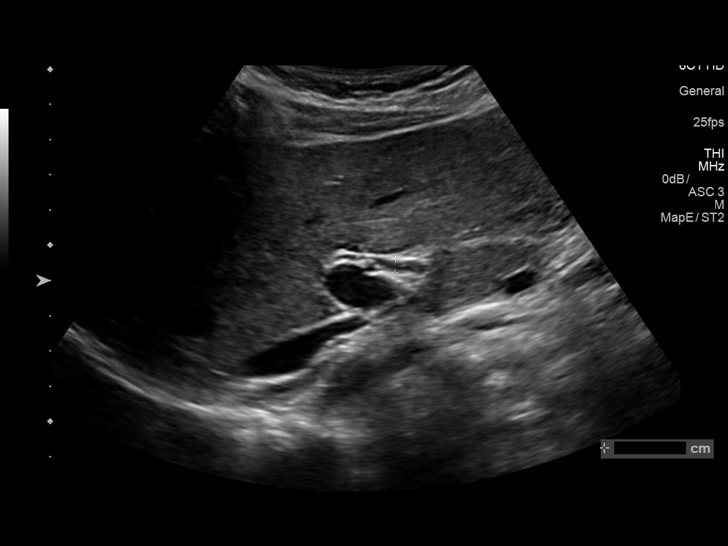
[im 19/114]
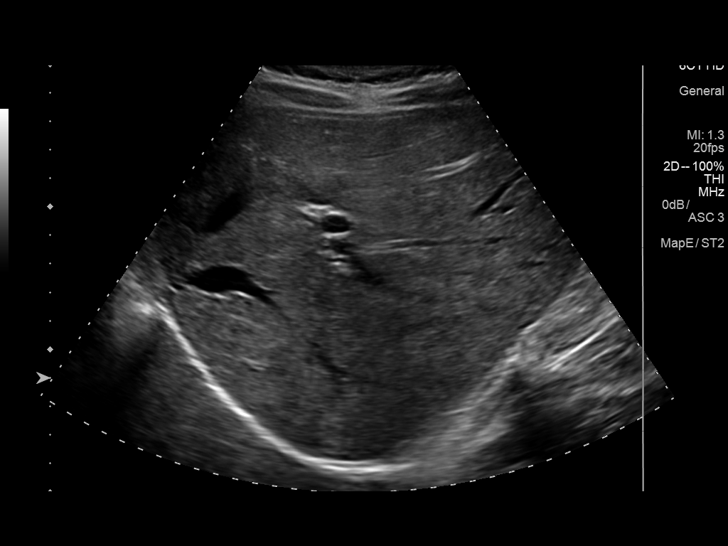
[im 29/114]
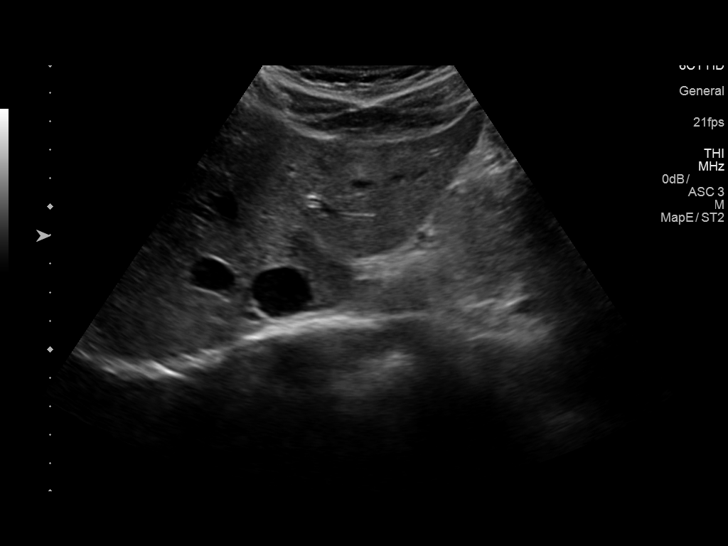
[im 38/114]
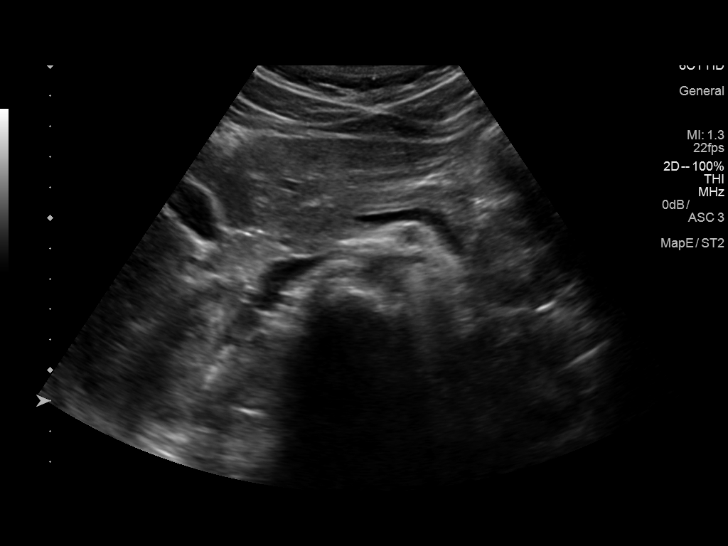
[im 43/114]
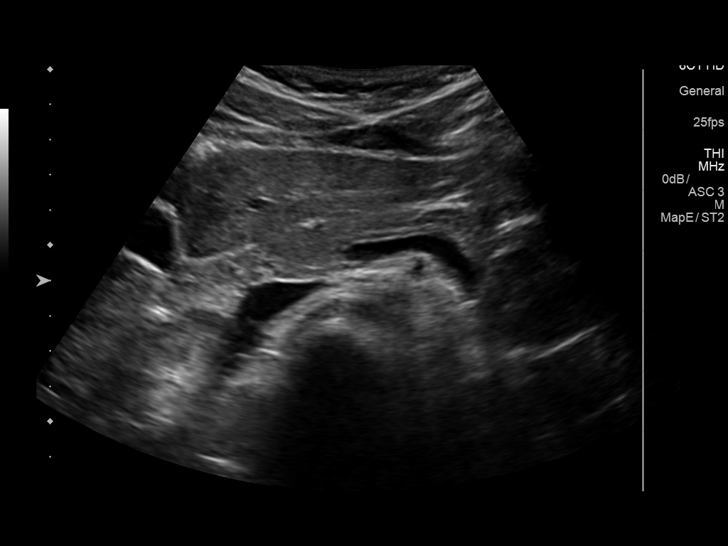
[im 52/114]
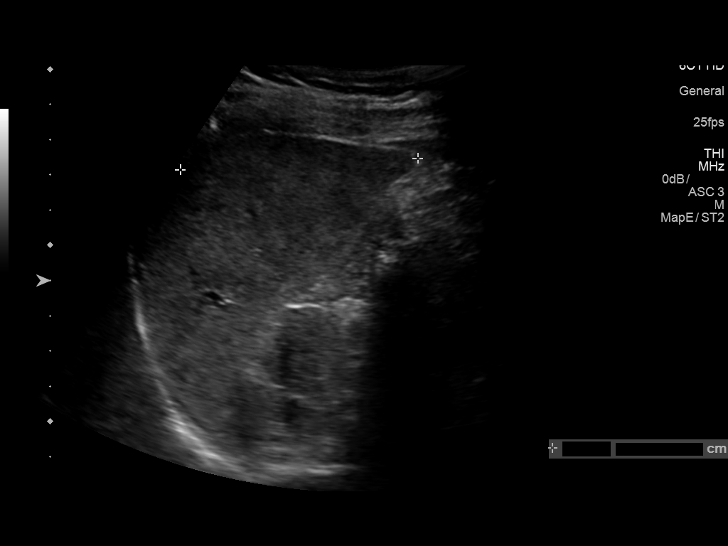
[im 62/114]
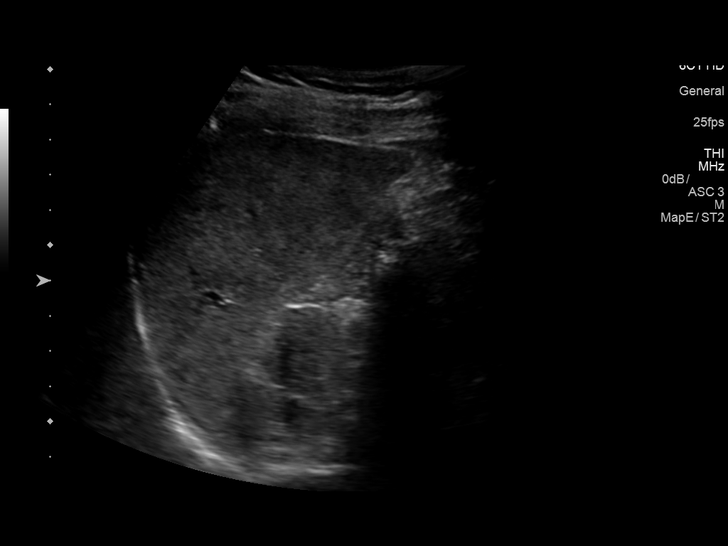
[im 71/114]
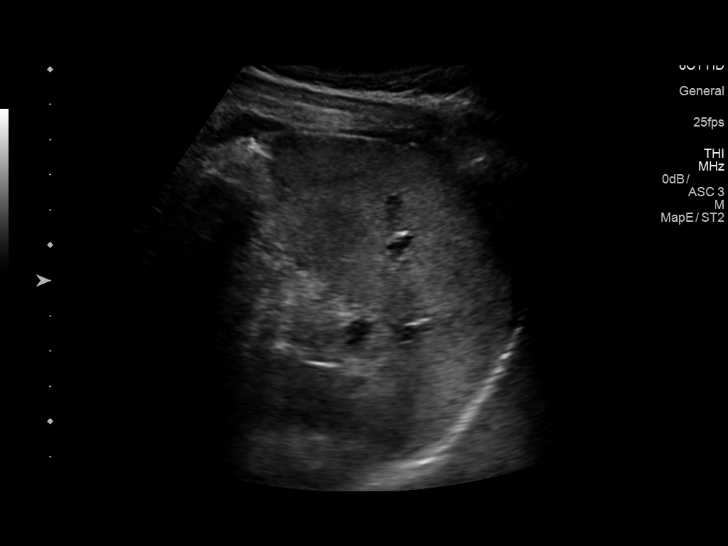
[im 76/114]
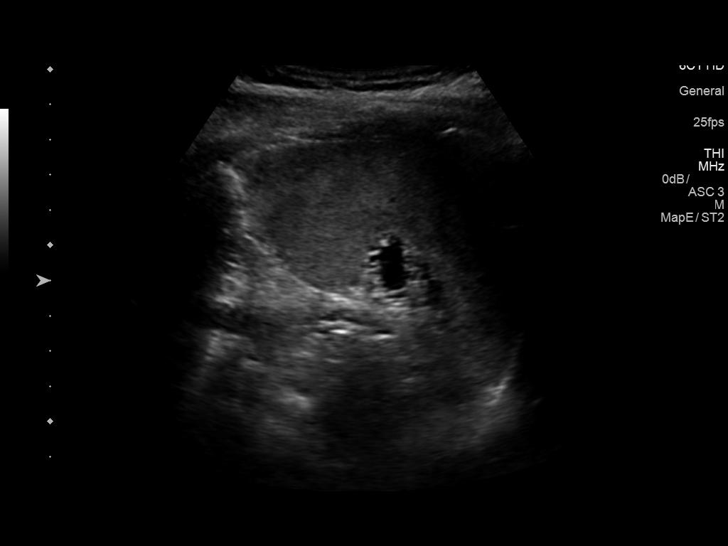
[im 85/114]
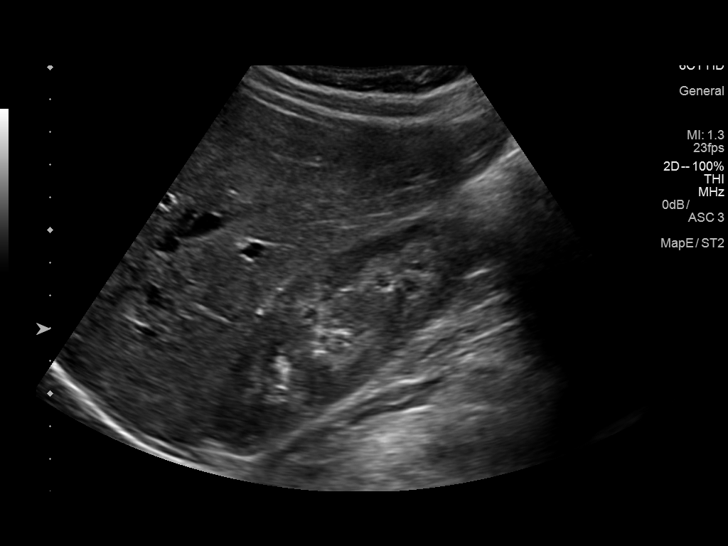
[im 95/114]
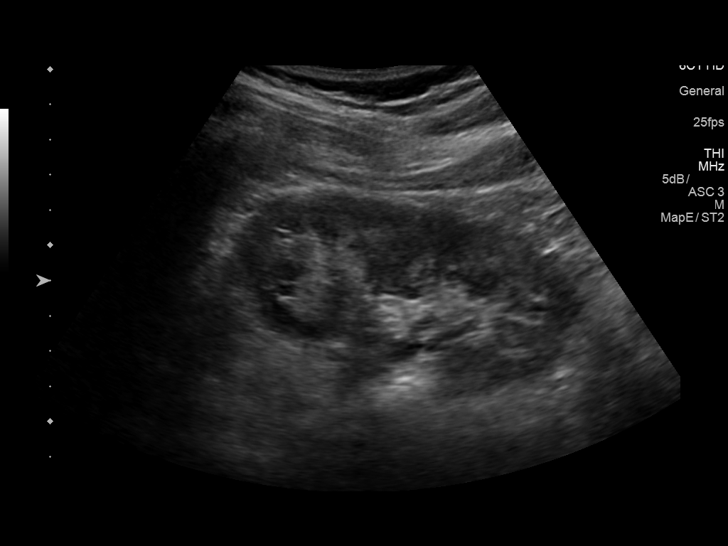
[im 104/114]
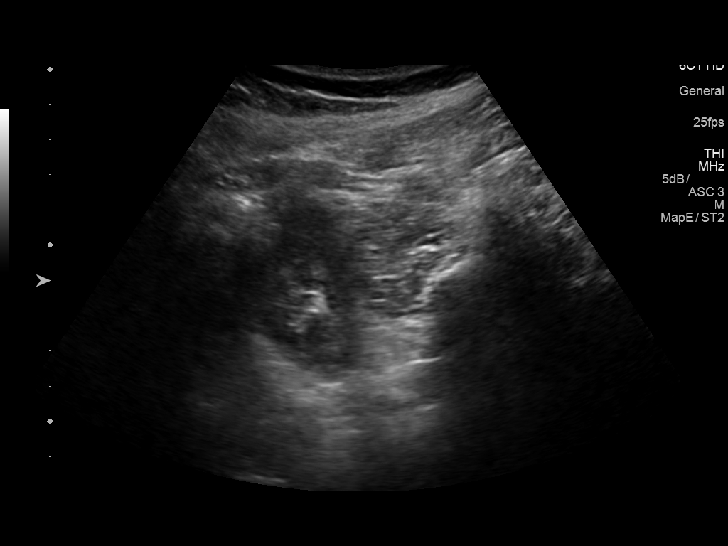
[im 114/114]
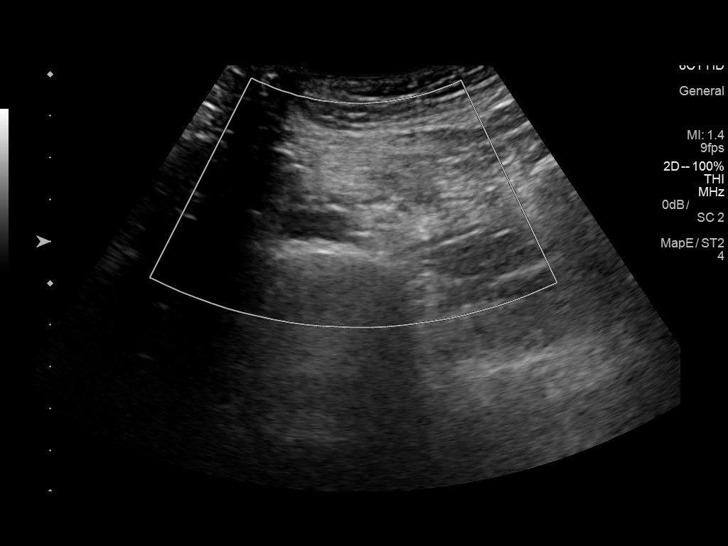

[14 of 25 positions shown; findings below may reference images not displayed]

FINDINGS: Gallbladder: No gallstones or wall thickening visualized. There is
no pericholecystic fluid. No sonographic Murphy sign noted by
sonographer.

Common bile duct: Diameter: 4 mm. No intrahepatic, common hepatic,
or common bile duct dilatation.

Liver: No focal lesion identified. Within normal limits in
parenchymal echogenicity. Portal vein is patent on color Doppler
imaging with normal direction of blood flow towards the liver.

IVC: No abnormality visualized.

Pancreas: No mass or inflammatory focus.

Spleen: Size and appearance within normal limits.

Right Kidney: Length: 10.8 cm. Echogenicity within normal limits. No
mass or hydronephrosis visualized.

Left Kidney: Length: 9.8 cm. Echogenicity within normal limits. No
mass or hydronephrosis visualized.

Abdominal aorta: No aneurysm visualized.

Other findings: No demonstrable ascites.
IMPRESSION: Study within normal limits.

## 2019-09-10 ENCOUNTER — Other Ambulatory Visit: Payer: Self-pay | Admitting: Allergy and Immunology

## 2019-09-24 ENCOUNTER — Other Ambulatory Visit: Payer: Self-pay | Admitting: *Deleted

## 2019-10-01 ENCOUNTER — Other Ambulatory Visit: Payer: Self-pay | Admitting: Allergy and Immunology

## 2019-10-01 MED ORDER — AIRDUO DIGIHALER 113-14 MCG/ACT IN AEPB: 2.0000 | INHALATION_SPRAY | Freq: Two times a day (BID) | RESPIRATORY_TRACT | 5 refills | Status: DC

## 2019-10-01 NOTE — Addendum Note (Signed)
Addended by: Teressa Senter on: 10/01/2019 12:27 PM   Modules accepted: Orders

## 2019-10-02 ENCOUNTER — Other Ambulatory Visit: Payer: Self-pay | Admitting: *Deleted

## 2019-10-05 ENCOUNTER — Telehealth: Payer: Self-pay | Admitting: *Deleted

## 2019-10-05 NOTE — Telephone Encounter (Signed)
PA has been submitted for ProAir HFA through CoverMyMeds and is currently pending approval or denial.

## 2019-10-08 ENCOUNTER — Other Ambulatory Visit: Payer: Self-pay | Admitting: Allergy and Immunology

## 2019-10-08 NOTE — Telephone Encounter (Addendum)
Fax from [facility] pharmacy:  ProAir has been approved 10/05/2019 to 10/04/2020.  Approval has been faxed to [facility]

## 2019-11-01 ENCOUNTER — Other Ambulatory Visit: Payer: Self-pay | Admitting: Allergy and Immunology

## 2019-11-25 ENCOUNTER — Other Ambulatory Visit: Payer: Self-pay | Admitting: Allergy and Immunology

## 2020-02-01 ENCOUNTER — Telehealth: Payer: Self-pay | Admitting: Allergy and Immunology

## 2020-02-01 ENCOUNTER — Other Ambulatory Visit: Payer: Self-pay

## 2020-02-01 MED ORDER — LEVOCETIRIZINE DIHYDROCHLORIDE 5 MG PO TABS: 5.0000 mg | ORAL_TABLET | Freq: Every day | ORAL | 0 refills | Status: DC | PRN

## 2020-02-01 MED ORDER — DEXILANT 60 MG PO CPDR: 1.0000 | DELAYED_RELEASE_CAPSULE | Freq: Every day | ORAL | 0 refills | Status: DC

## 2020-02-01 NOTE — Telephone Encounter (Signed)
Refill sent.

## 2020-02-01 NOTE — Telephone Encounter (Signed)
Patient called and needs to have Dexitant and [facility] [address] [phone] new ins [employer] with [employer]. [phone].

## 2020-02-05 ENCOUNTER — Encounter: Payer: Self-pay | Admitting: Allergy and Immunology

## 2020-02-05 ENCOUNTER — Ambulatory Visit: Payer: Self-pay | Admitting: Allergy and Immunology

## 2020-02-05 ENCOUNTER — Other Ambulatory Visit: Payer: Self-pay

## 2020-02-05 VITALS — BP 110/72 | HR 77 | Temp 98.5°F | Resp 16 | Ht 63.0 in | Wt 128.4 lb

## 2020-02-05 DIAGNOSIS — Z91018 Allergy to other foods: Secondary | ICD-10-CM

## 2020-02-05 DIAGNOSIS — J4550 Severe persistent asthma, uncomplicated: Secondary | ICD-10-CM

## 2020-02-05 DIAGNOSIS — J3089 Other allergic rhinitis: Secondary | ICD-10-CM

## 2020-02-05 MED ORDER — FAMOTIDINE 40 MG PO TABS
ORAL_TABLET | ORAL | 5 refills | Status: DC
Start: 2020-02-05 — End: 2020-10-15

## 2020-02-05 MED ORDER — PANTOPRAZOLE SODIUM 40 MG PO TBEC: DELAYED_RELEASE_TABLET | ORAL | 5 refills | Status: DC

## 2020-02-05 MED ORDER — LEVOCETIRIZINE DIHYDROCHLORIDE 5 MG PO TABS: 5.0000 mg | ORAL_TABLET | Freq: Every day | ORAL | 12 refills | Status: DC | PRN

## 2020-02-05 NOTE — Progress Notes (Signed)
[place] - [place] - [place] - [place] - [place]   Follow-up Note  Referring Provider: No ref. provider found Primary Provider: Patient, No Pcp Per Date of Office Visit: 02/05/2020  Subjective:   Elizabeth Booker (DOB: 31-May-1996) is a 24 y.o. female who [facility]2020 in re-evaluation of the following:  HPI: Elizabeth Booker returns to this clinic in evaluation of asthma and allergic rhinitis and reflux and food allergy directed against fruits and onions.  Her last visit to this clinic was 07 August 2019.  Overall she has had an excellent interval of time regarding her airway without the need for systemic steroid or antibiotic and no limitation on ability to exercise and rare use of a short acting bronchodilator while using Dulera 1-2 times per day depending on disease activity.  Likewise her nose has really been doing very well with intermittent use of a nasal steroid and consistent use of montelukast every day.  Her reflux is not as well controlled now that she has run out of Dexilant and cannot obtain this medication with her new insurance.  She has been using over-the-counter famotidine at 40 mg daily.  Allergies as of 02/05/2020      Reactions   Cucumber Extract Anaphylaxis   Mango Flavor Anaphylaxis   fruit   Onion Anaphylaxis   raw   Other Anaphylaxis   Cantaloupe, most vegetables and fruits   Watermelon Concentrate [citrullus Vulgaris] Anaphylaxis   Iohexol Swelling   Throat swelling oral , per Dr. Fredirick Lathe per Pre meds with IV contrast or do scan without //rls      Medication List      AirDuo Digihaler 113-14 MCG/ACT Aepb Generic drug: Fluticasone-Salmeterol(sensor) Inhale 2 puffs into the lungs in the morning and at bedtime.   Dexilant 60 MG capsule Generic drug: dexlansoprazole Take 1 capsule (60 mg total) by mouth daily.   Dulera 200-5 MCG/ACT Aero Generic drug: mometasone-formoterol Inhale 2 puffs into the lungs 2  (two) times daily.   EPINEPHrine 0.3 mg/0.3 mL Soaj injection Commonly known as: EPI-PEN INJECT CONTENTS OF 1 PEN AS NEEDED FOR LIFE-THREATENING ALLERGIC REACTION   famotidine 40 MG tablet Commonly known as: PEPCID Take 1 tablet daily in the afternoon as needed Started by: Lucien Budney Claudia Pollock, MD   fluticasone 50 MCG/ACT nasal spray Commonly known as: FLONASE Place 1-2 sprays into both nostrils as needed.   ibuprofen 200 MG tablet Commonly known as: ADVIL Take 400 mg by mouth every 6 (six) hours as needed. For pain   ipratropium-albuterol 0.5-2.5 (3) MG/3ML Soln Commonly known as: DUONEB USE 1 VIAL IN NEBULIZER EVERY 6 HOURS AS NEEDED   levocetirizine 5 MG tablet Commonly known as: XYZAL Take 1 tablet (5 mg total) by mouth daily as needed.   montelukast 10 MG tablet Commonly known as: SINGULAIR Take 1 tablet (10 mg total) by mouth daily. as directed   pantoprazole 40 MG tablet Commonly known as: PROTONIX Take one tablet daily in the morning Started by: Jenness Stemler Claudia Pollock, MD   PATANOL OP Apply to eye. Uses PRN   ProAir HFA 108 (90 Base) MCG/ACT inhaler Generic drug: albuterol INHALE 2 PUFFS BY MOUTH EVERY 4 HOURS AS NEEDED FOR WHEEZING OR SHORTNESS OF BREATH   albuterol 108 (90 Base) MCG/ACT inhaler Commonly known as: VENTOLIN HFA INHALE 2 PUFFS BY MOUTH EVERY 6 HOURS AS NEEDED FOR WHEEZINGFOR SHORTNESS OF BREATH   Qvar RediHaler 80 MCG/ACT inhaler Generic drug: beclomethasone INHALE 2  PUFFS BY MOUTH TWICE DAILY WITH ASTHMA FLAREUP       Past Medical History:  Diagnosis Date  . Allergy   . Asthma   . GERD (gastroesophageal reflux disease)   . Kidney infection     Past Surgical History:  Procedure Laterality Date  . ESOPHAGEAL MANOMETRY N/A 12/15/2016   Procedure: ESOPHAGEAL MANOMETRY (EM);  Surgeon: Iva Boop, MD;  [facility]: [facility];  Service: Endoscopy;  Laterality: N/A;  . ESOPHAGOGASTRODUODENOSCOPY (EGD) WITH ESOPHAGEAL DILATION  08/30/2016  . MOUTH  SURGERY    . [place] IMPEDANCE STUDY N/A 12/15/2016   Procedure: PH IMPEDANCE STUDY;  Surgeon: Iva Boop, MD;  Location: [facility];  Service: Endoscopy;  Laterality: N/A;    Review of systems negative except as noted in HPI / PMHx or noted below:  Review of Systems  Constitutional: Negative.   HENT: Negative.   Eyes: Negative.   Respiratory: Negative.   Cardiovascular: Negative.   Gastrointestinal: Negative.   Genitourinary: Negative.   Musculoskeletal: Negative.   Skin: Negative.   Neurological: Negative.   Endo/Heme/Allergies: Negative.   Psychiatric/Behavioral: Negative.      Objective:   Vitals:   02/05/20 1639  BP: 110/72  Pulse: 77  Resp: 16  Temp: 98.5 F (36.9 C)  SpO2: 97%   Height: 5\' 3"  (160 cm)  Weight: 128 lb 6.4 oz (58.2 kg)   Physical Exam Constitutional:      Appearance: She is not diaphoretic.  HENT:     Head: Normocephalic.     Right Ear: Tympanic membrane, ear canal and external ear normal.     Left Ear: Tympanic membrane, ear canal and external ear normal.     Nose: Nose normal. No mucosal edema or rhinorrhea.     Mouth/Throat:     Pharynx: Uvula midline. No oropharyngeal exudate.  Eyes:     Conjunctiva/sclera: Conjunctivae normal.  Neck:     Thyroid: No thyromegaly.     Trachea: Trachea normal. No tracheal tenderness or tracheal deviation.  Cardiovascular:     Rate and Rhythm: Normal rate and regular rhythm.     Heart sounds: Normal heart sounds, S1 normal and S2 normal. No murmur heard.   Pulmonary:     Effort: No respiratory distress.     Breath sounds: Normal breath sounds. No stridor. No wheezing or rales.  Lymphadenopathy:     Head:     Right side of head: No tonsillar adenopathy.     Left side of head: No tonsillar adenopathy.     Cervical: No cervical adenopathy.  Skin:    Findings: No erythema or rash.     Nails: There is no clubbing.  Neurological:     Mental Status: She is alert.     Diagnostics:    Spirometry  was performed and demonstrated an FEV1 of 3.22 at 101 % of predicted.  Assessment and Plan:   1. Asthma, severe persistent, well-controlled   2. Other allergic rhinitis   3. Food allergy      1. Continue to Treat reflux with pantoprazole 40 mg in a.m. and can add famotidine 40 mg tablet in p.m. if needed  2. Continue Dulera 200 - 2 inhalations 1-2 times a day depending on disease activity  3. Continue nasal fluticasone 1-2 sprays each nostril 1-7 times a week depending on disease activity  5. Continue montelukast 10 mg daily   6. Continue levo cetirizine, Patanol, and albuterol MDI if needed  7.  Return to clinic  in 12 months or earlier if problem  8. Obtain fall flu vaccine and consider covid vaccine  Maezie appears to be doing very well on her current plan with anti-inflammatory agents for her airway utilized on a consistent basis.  Her reflux is out of control and we will start her on pantoprazole to replace her Dexilant as she can no longer obtain her Dexilant from her insurance company.  Assuming she does well with this plan I will see her back in this clinic in 1 year or earlier if there is a problem.  Laurette Schimke, MD Allergy / Immunology  [facility]

## 2020-02-05 NOTE — Patient Instructions (Signed)
°  1. Continue to Treat reflux with pantoprazole 40 mg in a.m. and can add famotidine 40 mg tablet in p.m. if needed  2. Continue Dulera 200 - 2 inhalations 1-2 times a day depending on disease activity  3. Continue nasal fluticasone 1-2 sprays each nostril 1-7 times a week depending on disease activity  5. Continue montelukast 10 mg daily   6. Continue levo cetirizine, Patanol, and albuterol MDI if needed  7.  Return to clinic in 12 months or earlier if problem  8. Obtain fall flu vaccine and consider covid vaccine

## 2020-02-06 ENCOUNTER — Encounter: Payer: Self-pay | Admitting: Allergy and Immunology

## 2020-02-15 NOTE — Telephone Encounter (Signed)
Patient needs authorization on Dexilant and levocetirizine. Patient has been waiting since 7/23 and [facility] never got an [facility].  Please advise.

## 2020-02-15 NOTE — Telephone Encounter (Signed)
Prior authorization for Dexilant 60 mg has been approved with her new insurance and has been sent to the pharmacy. Patient is aware and she will purchase the levocetirizine(Xyzal) otc.

## 2020-02-15 NOTE — Telephone Encounter (Signed)
Dexilant is approved until 07/2020 and levocetirizine is otc.

## 2020-02-17 ENCOUNTER — Other Ambulatory Visit: Payer: Self-pay | Admitting: Allergy and Immunology

## 2020-02-26 ENCOUNTER — Telehealth: Payer: Self-pay | Admitting: Allergy and Immunology

## 2020-02-26 NOTE — Telephone Encounter (Signed)
Patient called and said that the Dexilant was not helping her reflux and she is using the pantoprazole too. [facility] [address] [phone] work number

## 2020-02-26 NOTE — Telephone Encounter (Signed)
Patient was advise to restart Dexilant per Dr. Lucie Leather and patient has been on it since 02/18/2020 and states it is not helping. Esophagus is burning, heartburn, bloating as well as nausea. She has not been on the pantoprazole at this time and is not sure if it help. She is currently taking Famotidine at night as well. Please advise since patient hasn't seen a Gastroenterologist in 4 years?

## 2020-02-26 NOTE — Telephone Encounter (Signed)
Unable to reach patient. Left voicemail for patient to return call

## 2020-02-26 NOTE — Telephone Encounter (Signed)
Left message for patient to call back. Patient was advise on last OV to take Protonix and can add Pepcid along with it.

## 2020-02-26 NOTE — Telephone Encounter (Signed)
There are many questions regarding this issue.  It is possible that she is having some type of adverse effect while using famotidine.  Lets have her continue on Dexilant, if she can acquire this from [employer], and lets add in Carafate 1 g three times a day and she can also add an over-the-counter chewable antacids that are calcium or magnesium based.  Exactly why she has developed such a significant problem with her reflux once again is not entirely clear.  If we cannot get this under control we may need to refer her back to gastroenterology for further evaluation.

## 2020-03-03 NOTE — Telephone Encounter (Signed)
Called and spoke with the patient and she stated that her reflux has improved, it is still there but it has improved. She has stopped the famotidine and is taking the Dexilant. She states that she will add on the over the counter chewable antacid that is calcium or magnesium based and see how that helps and will call back if she needs anything further.

## 2020-05-20 ENCOUNTER — Ambulatory Visit: Payer: 59 | Admitting: Allergy and Immunology

## 2020-05-20 ENCOUNTER — Other Ambulatory Visit: Payer: Self-pay

## 2020-05-20 ENCOUNTER — Encounter: Payer: Self-pay | Admitting: Allergy and Immunology

## 2020-05-20 VITALS — BP 98/56 | HR 89 | Temp 98.5°F | Resp 14 | Ht 63.25 in | Wt 127.6 lb

## 2020-05-20 DIAGNOSIS — K219 Gastro-esophageal reflux disease without esophagitis: Secondary | ICD-10-CM

## 2020-05-20 DIAGNOSIS — J4550 Severe persistent asthma, uncomplicated: Secondary | ICD-10-CM | POA: Diagnosis not present

## 2020-05-20 DIAGNOSIS — Z91018 Allergy to other foods: Secondary | ICD-10-CM

## 2020-05-20 DIAGNOSIS — G43909 Migraine, unspecified, not intractable, without status migrainosus: Secondary | ICD-10-CM | POA: Diagnosis not present

## 2020-05-20 DIAGNOSIS — J3089 Other allergic rhinitis: Secondary | ICD-10-CM | POA: Diagnosis not present

## 2020-05-20 MED ORDER — PANTOPRAZOLE SODIUM 40 MG PO TBEC: DELAYED_RELEASE_TABLET | ORAL | 5 refills | Status: DC

## 2020-05-20 MED ORDER — ALVESCO 160 MCG/ACT IN AERS
1.0000 | INHALATION_SPRAY | Freq: Two times a day (BID) | RESPIRATORY_TRACT | 0 refills | Status: DC
Start: 2020-05-20 — End: 2020-08-12

## 2020-05-20 NOTE — Progress Notes (Signed)
[place] [facility] [place] [facility] [place] [facility] [place] [facility] [place]   Follow-up Note  Referring Provider: No ref. provider found Primary Provider: Patient, No Pcp Per Date of Office Visit: 05/20/2020  Subjective:   Elizabeth Booker (DOB: [birth year]) is a 24 y.o. female who [facility] in re-evaluation of the following:  HPI: [name] returns to this clinic in evaluation of asthma and allergic rhinitis and reflux and a history of food allergy directed against fruits and onions.  Her last visit to this clinic was 05 February 2020.  [name] has several issues to discuss today.  First, her asthma has been doing relatively well but she apparently became "sick" last week.  She developed a upper respiratory tract infection associated with nasal congestion and sneezing and developed some wheezing and coughing and had to use her bronchodilator a few times a day.  She is improving.  She never developed any high fever or ugly nasal discharge or chest pain.  She did go to the urgent care center but no specific therapy was administered apparently and she had a negative Covid swab.  She continues to use Dulera twice a day on a consistent basis and continues on montelukast and nasal fluticasone.  Overall her airway issue was doing well until this most recent flareup and this appears to be improving.  Second, she has been having these episodes of acute onset dizziness with frank vertigo and confusion.  She has had these on and off over the course of the past year.  Sometimes there will be a cluster of these occurring 2-3 times per week and then she can go months without having any of these.  This past weekend she developed two these episodes and today she developed one these episodes.  They usually last about 2 or 3 minutes.  Significantly, it should be noted that she develops a headache after these episodes.  She has a frontal headache that feels as though there might be some  shooting pain from the right side more than the left side without any scotoma or other neurological symptoms.  She did have a problem with headaches in the distant past but apparently that issue had for the most part resolved.  She does not really consume any caffeine or chocolate.  Her sleep is undisturbed regarding both initiation and maintaining sleep.  Third, she has very bad reflux even with her Dexilant.  She is having regurgitation.  She will not be receiving a Covid vaccine.  She did contract native Covid infection at the tail end of last year.  She will not be receiving a flu vaccine.  Allergies as of 05/20/2020      Reactions   Cucumber Extract Anaphylaxis   Mango Flavor Anaphylaxis   fruit   Onion Anaphylaxis   raw   Other Anaphylaxis   Cantaloupe, most vegetables and fruits   Watermelon Concentrate [citrullus Vulgaris] Anaphylaxis   Iohexol Swelling   Throat swelling oral , per Dr. Fredirick Lathe per Pre meds with IV contrast or do scan without //rls      Medication List      Dexilant 60 MG capsule Generic drug: dexlansoprazole Take 1 capsule (60 mg total) by mouth daily.   Dulera 200-5 MCG/ACT Aero Generic drug: mometasone-formoterol Inhale 2 puffs into the lungs 2 (two) times daily.   EPINEPHrine 0.3 mg/0.3 mL Soaj injection Commonly known as: EPI-PEN INJECT CONTENTS OF 1 PEN AS NEEDED FOR LIFE-THREATENING ALLERGIC REACTION  famotidine 40 MG tablet Commonly known as: PEPCID Take 1 tablet daily in the afternoon as needed   fluticasone 50 MCG/ACT nasal spray Commonly known as: FLONASE Place 1-2 sprays into both nostrils as needed.   ibuprofen 200 MG tablet Commonly known as: ADVIL Take 400 mg by mouth every 6 (six) hours as needed. For pain   ipratropium-albuterol 0.5-2.5 (3) MG/3ML Soln Commonly known as: DUONEB USE 1 VIAL IN NEBULIZER EVERY 6 HOURS AS NEEDED   levocetirizine 5 MG tablet Commonly known as: XYZAL Take 1 tablet (5 mg total) by mouth daily as  needed.   montelukast 10 MG tablet Commonly known as: SINGULAIR TAKE 1 TABLET BY MOUTH ONCE DAILY AS DIRECTED   pantoprazole 40 MG tablet Commonly known as: PROTONIX Take one tablet daily in the morning   PATANOL OP Apply to eye. Uses PRN   ProAir HFA 108 (90 Base) MCG/ACT inhaler Generic drug: albuterol INHALE 2 PUFFS BY MOUTH EVERY 4 HOURS AS NEEDED FOR WHEEZING OR SHORTNESS OF BREATH   albuterol 108 (90 Base) MCG/ACT inhaler Commonly known as: VENTOLIN HFA INHALE 2 PUFFS BY MOUTH EVERY 6 HOURS AS NEEDED FOR WHEEZINGFOR SHORTNESS OF BREATH   Qvar RediHaler 80 MCG/ACT inhaler Generic drug: beclomethasone INHALE 2 PUFFS BY MOUTH TWICE DAILY WITH ASTHMA FLAREUP       Past Medical History:  Diagnosis Date  . Allergy   . Asthma   . GERD (gastroesophageal reflux disease)   . Kidney infection     Past Surgical History:  Procedure Laterality Date  . ESOPHAGEAL MANOMETRY N/A 12/15/2016   Procedure: ESOPHAGEAL MANOMETRY (EM);  Surgeon: Iva Boop, MD;  Location: [facility];  Service: Endoscopy;  Laterality: N/A;  . ESOPHAGOGASTRODUODENOSCOPY (EGD) WITH ESOPHAGEAL DILATION  08/30/2016  . MOUTH SURGERY    . PH IMPEDANCE STUDY N/A 12/15/2016   Procedure: PH IMPEDANCE STUDY;  Surgeon: Iva Boop, MD;  Location: [facility];  Service: Endoscopy;  Laterality: N/A;    Review of systems negative except as noted in HPI / PMHx or noted below:  Review of Systems  Constitutional: Negative.   HENT: Negative.   Eyes: Negative.   Respiratory: Negative.   Cardiovascular: Negative.   Gastrointestinal: Negative.   Genitourinary: Negative.   Musculoskeletal: Negative.   Skin: Negative.   Neurological: Negative.   Endo/Heme/Allergies: Negative.   Psychiatric/Behavioral: Negative.      Objective:   Vitals:   05/20/20 1149  BP: (!) 98/56  Pulse: 89  Resp: 14  Temp: 98.5 F (36.9 C)  SpO2: 99%   Height: 5' 3.25" (160.7 cm)  Weight: 127 lb 9.6 oz (57.9 kg)    Physical Exam Constitutional:      Appearance: She is not diaphoretic.  HENT:     Head: Normocephalic.     Right Ear: Tympanic membrane, ear canal and external ear normal.     Left Ear: Tympanic membrane, ear canal and external ear normal.     Nose: Nose normal. No mucosal edema or rhinorrhea.     Mouth/Throat:     Pharynx: Uvula midline. No oropharyngeal exudate.  Eyes:     Conjunctiva/sclera: Conjunctivae normal.  Neck:     Thyroid: No thyromegaly.     Trachea: Trachea normal. No tracheal tenderness or tracheal deviation.  Cardiovascular:     Rate and Rhythm: Normal rate and regular rhythm.     Heart sounds: Normal heart sounds, S1 normal and S2 normal. No murmur heard.   Pulmonary:  Effort: No respiratory distress.     Breath sounds: Normal breath sounds. No stridor. No wheezing or rales.  Lymphadenopathy:     Head:     Right side of head: No tonsillar adenopathy.     Left side of head: No tonsillar adenopathy.     Cervical: No cervical adenopathy.  Skin:    Findings: No erythema or rash.     Nails: There is no clubbing.  Neurological:     Mental Status: She is alert.     Diagnostics:    Spirometry was performed and demonstrated an FEV1 of 3.20 at 100 % of predicted.  Assessment and Plan:   1. Not well controlled severe persistent asthma   2. Other allergic rhinitis   3. Food allergy   4. Migraine syndrome   5. Gastroesophageal reflux disease, unspecified whether esophagitis present      1. Continue inflammation with the following:   A. Dulera 200 - 2 inhalations 2 times a day  B. fluticasone 1-2 sprays each nostril 1-7 times a week   C. montelukast 10 mg 1 time per day   2. Treat reflux with the following:   A. Continue Dexilant 60 mg in AM  B. Add pantoprazole 40 mg in PM  3. Continue levo cetirizine, Patanol, and albuterol MDI/nebulizer if needed  4. "Action Plan" for flare-up:   A. Add Alvesco 160 - 1 inhalation 2 times per day to  [facility]  B. Use albuterol if needed  5. Visit with neurologist for complicated migraine headache.  6.  Return to clinic in 12 months or earlier if problem  7. Consider flu vaccine and consider covid vaccine  8.  Perform a home pregnancy test  Nalani has recently had a flare of her respiratory tract disease most likely secondary to a viral trigger and she appears to be improving regarding that issue.  I given her a "action plan" to initiate whenever she develops a flareup in the future.  Will attempt to treat her reflux a little more aggressively with the therapy noted above and she can keep in contact with me noting her response to this approach.  Her intermittent episodes of acute onset vertigo and confusion and headache sounds as though they are complicated migraine and will refer her onto a neurologist for further evaluation of this issue.  She has recently had a cluster of these headaches and she is not really using any form of birth control and I have asked her to perform a home pregnancy test to exclude pregnancy as a trigger for these migraines.  Laurette Schimke, MD Allergy / Immunology [facility]

## 2020-05-20 NOTE — Patient Instructions (Addendum)
°  1. Continue inflammation with the following:   A. Dulera 200 - 2 inhalations 2 times a day  B. fluticasone 1-2 sprays each nostril 1-7 times a week   C. montelukast 10 mg 1 time per day   2. Treat reflux with the following:   A. Continue Dexilant 60 mg in AM  B. Add pantoprazole 40 mg in PM  3. Continue levo cetirizine, Patanol, and albuterol MDI/nebulizer if needed  4. "Action Plan" for flare-up:   A. Add Alvesco 160 - 1 inhalation 2 times per day to [facility]  B. Use albuterol if needed  5. Visit with neurologist for complicated migraine headache.  6.  Return to clinic in 12 months or earlier if problem  7. Consider flu vaccine and consider covid vaccine  8.  Perform a home pregnancy test

## 2020-05-21 ENCOUNTER — Encounter: Payer: Self-pay | Admitting: Allergy and Immunology

## 2020-05-23 ENCOUNTER — Telehealth: Payer: Self-pay | Admitting: Allergy and Immunology

## 2020-05-23 ENCOUNTER — Other Ambulatory Visit: Payer: Self-pay

## 2020-05-23 ENCOUNTER — Other Ambulatory Visit: Payer: Self-pay | Admitting: Allergy and Immunology

## 2020-05-23 MED ORDER — PANTOPRAZOLE SODIUM 40 MG PO TBEC: 40.0000 mg | DELAYED_RELEASE_TABLET | Freq: Every day | ORAL | 5 refills | Status: DC

## 2020-05-23 MED ORDER — DEXILANT 60 MG PO CPDR: 1.0000 | DELAYED_RELEASE_CAPSULE | ORAL | 5 refills | Status: DC

## 2020-05-23 NOTE — Telephone Encounter (Signed)
Patient called and said meds were not called in needs dexilant 60mg . And [facility]ix to [facility] on [address] [phone].

## 2020-05-23 NOTE — Telephone Encounter (Signed)
Refills sent in to pts pharmacy  

## 2020-06-17 ENCOUNTER — Other Ambulatory Visit: Payer: Self-pay | Admitting: Allergy and Immunology

## 2020-08-12 ENCOUNTER — Ambulatory Visit: Payer: 59 | Admitting: Allergy and Immunology

## 2020-08-12 ENCOUNTER — Telehealth: Payer: Self-pay

## 2020-08-12 ENCOUNTER — Other Ambulatory Visit: Payer: Self-pay

## 2020-08-12 VITALS — BP 112/60 | HR 89 | Temp 98.1°F | Resp 16 | Ht 63.75 in | Wt 133.2 lb

## 2020-08-12 DIAGNOSIS — J3089 Other allergic rhinitis: Secondary | ICD-10-CM

## 2020-08-12 DIAGNOSIS — Z91018 Allergy to other foods: Secondary | ICD-10-CM | POA: Diagnosis not present

## 2020-08-12 DIAGNOSIS — G43909 Migraine, unspecified, not intractable, without status migrainosus: Secondary | ICD-10-CM | POA: Diagnosis not present

## 2020-08-12 DIAGNOSIS — J4550 Severe persistent asthma, uncomplicated: Secondary | ICD-10-CM

## 2020-08-12 DIAGNOSIS — K219 Gastro-esophageal reflux disease without esophagitis: Secondary | ICD-10-CM

## 2020-08-12 MED ORDER — ALBUTEROL SULFATE HFA 108 (90 BASE) MCG/ACT IN AERS: INHALATION_SPRAY | RESPIRATORY_TRACT | 3 refills | Status: AC

## 2020-08-12 MED ORDER — PROAIR HFA 108 (90 BASE) MCG/ACT IN AERS
INHALATION_SPRAY | RESPIRATORY_TRACT | 1 refills | Status: DC
Start: 2020-08-12 — End: 2021-10-27

## 2020-08-12 MED ORDER — PANTOPRAZOLE SODIUM 40 MG PO TBEC: 40.0000 mg | DELAYED_RELEASE_TABLET | Freq: Every day | ORAL | 11 refills | Status: DC

## 2020-08-12 MED ORDER — MONTELUKAST SODIUM 10 MG PO TABS: ORAL_TABLET | ORAL | 3 refills | Status: DC

## 2020-08-12 MED ORDER — DEXLANSOPRAZOLE 60 MG PO CPDR
1.0000 | DELAYED_RELEASE_CAPSULE | ORAL | 11 refills | Status: DC
Start: 2020-08-12 — End: 2020-11-13

## 2020-08-12 MED ORDER — LEVOCETIRIZINE DIHYDROCHLORIDE 5 MG PO TABS
5.0000 mg | ORAL_TABLET | Freq: Every day | ORAL | 11 refills | Status: DC | PRN
Start: 2020-08-12 — End: 2021-05-19

## 2020-08-12 MED ORDER — IPRATROPIUM-ALBUTEROL 0.5-2.5 (3) MG/3ML IN SOLN
RESPIRATORY_TRACT | 2 refills | Status: DC
Start: 2020-08-12 — End: 2023-11-21

## 2020-08-12 MED ORDER — ALVESCO 160 MCG/ACT IN AERS
1.0000 | INHALATION_SPRAY | Freq: Two times a day (BID) | RESPIRATORY_TRACT | 11 refills | Status: DC
Start: 2020-08-12 — End: 2023-11-21

## 2020-08-12 MED ORDER — DULERA 200-5 MCG/ACT IN AERO
2.0000 | INHALATION_SPRAY | Freq: Two times a day (BID) | RESPIRATORY_TRACT | 5 refills | Status: DC
Start: 2020-08-12 — End: 2020-08-18

## 2020-08-12 MED ORDER — FLUTICASONE PROPIONATE 50 MCG/ACT NA SUSP
1.0000 | NASAL | 8 refills | Status: AC | PRN
Start: 2020-08-12 — End: ?

## 2020-08-12 NOTE — Patient Instructions (Addendum)
  1. Continue inflammation with the following:   A. Dulera 200 - 2 inhalations 2 times a day  B. fluticasone 1-2 sprays each nostril 1-7 times a week   C. montelukast 10 mg 1 time per day   2. Evaluate and treat reflux with the following:   A. Dexilant 60 mg in AM  B. Pantoprazole 40 mg in PM  C. Revisit with GI - possible Nissen fundoplication???  3. Continue levo cetirizine, Pataday, and albuterol MDI/nebulizer if needed  4. "Action Plan" for flare-up:   A. Add Alvesco 160 - 1 inhalation 2 times per day to [facility]  B. Use albuterol if needed  5. Visit with neurologist for complicated migraine headache tomorrow  6.  Return to clinic in 12 months or earlier if problem

## 2020-08-12 NOTE — Telephone Encounter (Signed)
Please send a referral so she  Can Revisit with GI - possible Nissen fundoplication

## 2020-08-12 NOTE — Progress Notes (Signed)
[facility]    Provider:  Dr Lucia Gaskins Requesting Provider: Jessica Priest, MD Primary Care Provider:  Patient, No Pcp Per  CC:  Migraines and episodes of near syncope  HPI:  Elizabeth Booker is a 25 y.o. female here as requested by Jessica Priest, MD for complicated migraine headache and cluster headache.  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  I reviewed Dr. Kathyrn Lass notes: Patient's been having episodes of acute onset dizziness with frank vertigo and confusion, on and off over the past year, sometimes there will be a cluster of these occurring 2-3 times per week and then she can go months without having any of these, the past weekend she developed 2 episodes and they usually last about 2 to 3 minutes, she develops a headache after these episodes, she has a frontal headache that feels as though there might be some shooting pain from the right side more than the left side without any scotoma or other neurological symptoms, she did have a problem with headaches in the distant past, she does not really consume any caffeine or chocolate, her sleep is undisturbed.  I was able to find a note from [facility] from April 26, 2019 where patient complained of right-sided migraine headache, dizziness and lightheadedness, ongoing migraine for 3 days, she tried Tylenol and Motrin at home, she had not been eating, mild to moderate, episode was highly suspicious for vasovagal near syncope per physician she was seen, also may be related to migraine, dehydration symptoms.  Migraines and headaches ongoing for 2 years. 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 She has headaches with these episodes. Usually on the right, pulsating/pounding and  throbbing, photo/phonophbia, her eyes hurt, movement makes it worse, she has nausea, they can last 24 hours and be severe. She had the worst headache of her life 2 days ago. Ibuprofen not working. Getting worse. She has blurry vision/vison changes with the headaches. Sometimes she sees "sparkles". She can wake up with them in the morning, positional it is worse if she moves her headache worsens can't bend over it hurts. She has 5-10 headache days a month and some of those are more sinus headache, she has a severe headache 2-3 x a month, moderately severe 2-3. With episodes of pre-syncope, she gets lightheaded and feels like she is going to pass out, happens after she yawns or sneezes, she starts getting clammy, she feels confused. No seizure history. No other focal neurologic deficits, associated symptoms, inciting events or modifiable factors.  Reviewed notes, labs and imaging from outside physicians, which showed:   From a review of records, medications tried in the past that can be used in migraine management include: Tylenol, ibuprofen, Depo-Medrol injection, prednisone tablets,  Review of Systems: Patient complains of symptoms per HPI as well as the following symptoms: near syncope, headaches. Pertinent negatives and positives per HPI. All others negative.   Social History   Socioeconomic History  . Marital status: Married    Spouse name: Not on file  . Number of children: 0  . Years of education: Not on file  . Highest education level: Bachelor's degree (e.g., BA, AB, BS)  Occupational History  . Not on file  Tobacco Use  . Smoking status: Never Smoker  . Smokeless tobacco:  Never Used  Vaping Use  . Vaping Use: Never used  Substance and Sexual Activity  . Alcohol use: No    Alcohol/week: 0.0 standard drinks  . Drug use: No  . Sexual activity: Not on file    Comment: nuva ring  Other Topics Concern  . Not on file  Social History Narrative   Lives at home with spouse   Right  handed   Caffeine: rarely   Social Determinants of Health   Financial Resource Strain: Not on file  Food Insecurity: Not on file  Transportation Needs: Not on file  Physical Activity: Not on file  Stress: Not on file  Social Connections: Not on file  Intimate Partner Violence: Not on file    Family History  Problem Relation Age of Onset  . High blood pressure Mother   . High blood pressure Father   . Schizophrenia Paternal Grandfather   . Colon cancer Neg Hx   . Esophageal cancer Neg Hx   . Rectal cancer Neg Hx   . Stomach cancer Neg Hx   . Allergic rhinitis Neg Hx   . Angioedema Neg Hx   . Asthma Neg Hx   . Atopy Neg Hx   . Eczema Neg Hx   . Immunodeficiency Neg Hx   . Urticaria Neg Hx   . Migraines Neg Hx     Past Medical History:  Diagnosis Date  . Allergy   . Asthma   . GERD (gastroesophageal reflux disease)   . Kidney infection     Patient Active Problem List   Diagnosis Date Noted  . Near syncope 08/13/2020  . Migraine with aura and without status migrainosus, not intractable 08/13/2020  . Seasonal and perennial allergic rhinitis 08/18/2017  . Seasonal allergic conjunctivitis 08/18/2017  . Severe persistent asthma, uncomplicated 08/18/2017  . Gastroesophageal reflux disease 08/18/2017  . Food allergy 08/18/2017  . Heartburn   . Asthma with acute exacerbation 08/05/2015  . Allergic rhinoconjunctivitis 08/05/2015  . Acute sinusitis 08/05/2015    Past Surgical History:  Procedure Laterality Date  . ESOPHAGEAL MANOMETRY N/A 12/15/2016   Procedure: ESOPHAGEAL MANOMETRY (EM);  Surgeon: Iva Boop, MD;  Location: [facility];  Service: Endoscopy;  Laterality: N/A;  . ESOPHAGOGASTRODUODENOSCOPY (EGD) WITH ESOPHAGEAL DILATION  08/30/2016  . MOUTH SURGERY    . PH IMPEDANCE STUDY N/A 12/15/2016   Procedure: PH IMPEDANCE STUDY;  Surgeon: Iva Boop, MD;  Location: [facility];  Service: Endoscopy;  Laterality: N/A;    Current Outpatient Medications   Medication Sig Dispense Refill  . albuterol (VENTOLIN HFA) 108 (90 Base) MCG/ACT inhaler INHALE 2 PUFFS BY MOUTH EVERY 6 HOURS AS NEEDED FOR WHEEZINGFOR SHORTNESS OF BREATH 18 g 3  . ciclesonide (ALVESCO) 160 MCG/ACT inhaler Inhale 1 puff into the lungs 2 (two) times daily. 7 g 11  . dexlansoprazole (DEXILANT) 60 MG capsule Take 1 capsule (60 mg total) by mouth every morning. 30 capsule 11  . EPINEPHRINE 0.3 mg/0.3 mL IJ SOAJ injection INJECT CONTENTS OF 1 PEN AS NEEDED FOR LIFE-THREATENING ALLERGIC REACTION 2 each 1  . famotidine (PEPCID) 40 MG tablet Take 1 tablet daily in the afternoon as needed 30 tablet 5  . fluticasone (FLONASE) 50 MCG/ACT nasal spray Place 1-2 sprays into both nostrils as needed. 16 g 8  . Fluticasone-Salmeterol,sensor, (AIRDUO DIGIHALER) 113-14 MCG/ACT AEPB Inhale 2 puffs into the lungs in the morning and at bedtime. 1 each 5  . ibuprofen (ADVIL,MOTRIN) 200 MG tablet Take 400 mg  by mouth every 6 (six) hours as needed. For pain    . ipratropium-albuterol (DUONEB) 0.5-2.5 (3) MG/3ML SOLN USE 1 VIAL IN NEBULIZER EVERY 6 HOURS AS NEEDED 180 mL 2  . levocetirizine (XYZAL) 5 MG tablet Take 1 tablet (5 mg total) by mouth daily as needed. 30 tablet 11  . mometasone-formoterol (DULERA) 200-5 MCG/ACT AERO Inhale 2 puffs into the lungs 2 (two) times daily. 13 g 5  . montelukast (SINGULAIR) 10 MG tablet TAKE 1 TABLET EVERY DAY AS DIRECTED 90 tablet 3  . Olopatadine HCl (PATANOL OP) Apply to eye. Uses PRN    . pantoprazole (PROTONIX) 40 MG tablet Take 1 tablet (40 mg total) by mouth at bedtime. Take one tablet daily in the morning 30 tablet 11  . PROAIR HFA 108 (90 Base) MCG/ACT inhaler INHALE 2 PUFFS BY MOUTH EVERY 4 HOURS AS NEEDED FOR WHEEZING OR SHORTNESS OF BREATH 18 g 1  . QVAR REDIHALER 80 MCG/ACT inhaler INHALE 2 PUFFS BY MOUTH TWICE DAILY WITH ASTHMA FLAREUP 11 g 0  . rizatriptan (MAXALT-MLT) 10 MG disintegrating tablet Take 1 tablet (10 mg total) by mouth as needed for  migraine. May repeat in 2 hours if needed 9 tablet 11   Current Facility-Administered Medications  Medication Dose Route Frequency Provider Last Rate Last Admin  . Mepolizumab SOLR 100 mg  100 mg Subcutaneous Q28 days Jessica Priest, MD   100 mg at 06/20/18 1715    Allergies as of 08/13/2020 - Review Complete 08/13/2020  Allergen Reaction Noted  . Iohexol Swelling 05/25/2017    Vitals: BP 94/61 (BP Location: Left Arm, Patient Position: Sitting)   Pulse 78   Ht 5' 3.5" (1.613 m)   Wt 131 lb (59.4 kg)   BMI 22.84 kg/m  Last Weight:  Wt Readings from Last 1 Encounters:  08/13/20 131 lb (59.4 kg)   Last Height:   Ht Readings from Last 1 Encounters:  08/13/20 5' 3.5" (1.613 m)     Physical exam: Exam: Gen: NAD, conversant, well nourised, well groomed                     CV: RRR, no MRG. No Carotid Bruits. No peripheral edema, warm, nontender Eyes: Conjunctivae clear without exudates or hemorrhage  Neuro: Detailed Neurologic Exam  Speech:    Speech is normal; fluent and spontaneous with normal comprehension.  Cognition:    The patient is oriented to person, place, and time;     recent and remote memory intact;     language fluent;     normal attention, concentration,     fund of knowledge Cranial Nerves:    The pupils are equal, round, and reactive to light. The fundi are normal and spontaneous venous pulsations are present. Visual fields are full to finger confrontation. Extraocular movements are intact. Trigeminal sensation is intact and the muscles of mastication are normal. The face is symmetric. The palate elevates in the midline. Hearing intact. Voice is normal. Shoulder shrug is normal. The tongue has normal motion without fasciculations.   Coordination:    No dysmetria or ataxia  Gait:    Normal native gait  Motor Observation:    No asymmetry, no atrophy, and no involuntary movements noted. Tone:    Normal muscle tone.    Posture:    Posture is normal.  normal erect    Strength:    Strength is V/V in the upper and lower limbs.      Sensation:  intact to LT     Reflex Exam:  DTR's:    Deep tendon reflexes in the upper and lower extremities are normal bilaterally.   Toes:    The toes are downgoing bilaterally.   Clonus:    Clonus is absent.    Assessment/Plan:  25 year old with migraines with and without aura (. She also has episodes of what sounds like vaso-vagal near syncope(can happen with sneezing) and has also been to other physicians in the past and given that diagnosis  however we will get an MRI of the brain and EEG seizure protocol. She does not have a primary care and I highly encouraged her to get one so she can have a cardiac workup.   MRI of the brain w/wo contrast EEG Acute management: Rizatriptan: Please take one tablet at the onset of your headache. If it does not improve the symptoms please take one additional tablet. Do not take more then 2 tablets in 24hrs. Do not take use more then 2 to 3 times in a week. She declines preventative, would like as needed sounds like she only has 2-3 migraine days a month and some other headaches, we discussed options in detail She needs a primary care doctor for routine care and possibly cardiac evaluation, I discussed with patient.  Routine blood work today Discussed risk of stroke in women with migraine with aura. Gave her an information packet on migraines and vaso-vagal syncope and conservative treatment measures  Orders Placed This Encounter  Procedures  . MR BRAIN W WO CONTRAST  . CBC with Differential/Platelets  . Comprehensive metabolic panel  . TSH  . EEG   Meds ordered this encounter  Medications  . rizatriptan (MAXALT-MLT) 10 MG disintegrating tablet    Sig: Take 1 tablet (10 mg total) by mouth as needed for migraine. May repeat in 2 hours if needed    Dispense:  9 tablet    Refill:  11   Discussed:  There is increased risk for stroke in women with migraine with  aura and a contraindication for the combined contraceptive pill for use by women who have migraine with aura. The risk for women with migraine without aura is lower. However other risk factors like smoking are far more likely to increase stroke risk than migraine. There is a recommendation for no smoking and for the use of OCPs without estrogen such as progestogen only pills particularly for women with migraine with aura.Marland Kitchen People who have migraine headaches with auras may be 3 times more likely to have a stroke caused by a blood clot, compared to migraine patients who don't see auras. Women who take hormone-replacement therapy may be 30 percent more likely to suffer a clot-based stroke than women not taking medication containing estrogen. Other risk factors like smoking and high blood pressure may be  much more important.  Discussed: To prevent or relieve headaches, try the following: Cool Compress. Lie down and place a cool compress on your head.  Avoid headache triggers. If certain foods or odors seem to have triggered your migraines in the past, avoid them. A headache diary might help you identify triggers.  Include physical activity in your daily routine. Try a daily walk or other moderate aerobic exercise.  Manage stress. Find healthy ways to cope with the stressors, such as delegating tasks on your to-do list.  Practice relaxation techniques. Try deep breathing, yoga, massage and visualization.  Eat regularly. Eating regularly scheduled meals and maintaining a healthy diet might  help prevent headaches. Also, drink plenty of fluids.  Follow a regular sleep schedule. Sleep deprivation might contribute to headaches Consider biofeedback. With this mind-body technique, you learn to control certain bodily functions -- such as muscle tension, heart rate and blood pressure -- to prevent headaches or reduce headache pain.    Proceed to emergency room if you experience new or worsening symptoms or symptoms  do not resolve, if you have new neurologic symptoms or if headache is severe, or for any concerning symptom.   Provided education and documentation from American headache Society toolbox including articles on: chronic migraine medication overuse headache, chronic migraines, prevention of migraines, behavioral and other nonpharmacologic treatments for headache.  Cc: Kozlow, Alvira PhilipsEric J, MD,  Patient, No Pcp Per  Naomie DeanAntonia Rayland Hamed, MD  [facility] [address] [place], [place] [id number]  Phone [phone] Fax [phone]

## 2020-08-12 NOTE — Progress Notes (Signed)
[place] [facility] [place] - [place] - [place] - [place]   Follow-up Note  Referring Provider: No ref. provider found Primary Provider: Patient, No Pcp Per Date of Office Visit: 08/12/2020  Subjective:   Elizabeth Booker (DOB: October 26, 1995) is a 25 y.o. female who [facility] in re-evaluation of the following:  HPI: Lorine returns to this clinic in evaluation of asthma and allergic rhinitis and reflux and history of food allergy directed against foods and onions.  Her last visit to this clinic was 20 May 2020.  Overall her airway has really been doing quite well and she has not required a systemic steroid or antibiotic for any type of airway issue and she rarely uses a short acting bronchodilator while she continues to use controller agents on a consistent basis including the use of a combination inhaler and a leukotriene modifier and some occasional nasal steroid.  She still continues to have significant problems with her reflux.  During her last visit we increased her treatment for reflux and this did not really help at all and she still has bad regurgitation.  She is visiting with her neurologist this week concerning her complicated migraines.  Allergies as of 08/12/2020      Reactions   Cucumber Extract Anaphylaxis   Mango Flavor Anaphylaxis   fruit   Onion Anaphylaxis   raw   Other Anaphylaxis   Cantaloupe, most vegetables and fruits   Watermelon Concentrate [citrullus Vulgaris] Anaphylaxis   Iohexol Swelling   Throat swelling oral , per Dr. Fredirick Lathe per Pre meds with IV contrast or do scan without //rls      Medication List       Accurate as of August 12, 2020  5:12 PM. If you have any questions, ask your nurse or doctor.        dexlansoprazole 60 MG capsule Commonly known as: Dexilant Take 1 capsule (60 mg total) by mouth every morning.   Dulera 200-5 MCG/ACT Aero Generic drug: mometasone-formoterol Inhale 2 puffs  into the lungs 2 (two) times daily.   EPINEPHrine 0.3 mg/0.3 mL Soaj injection Commonly known as: EPI-PEN INJECT CONTENTS OF 1 PEN AS NEEDED FOR LIFE-THREATENING ALLERGIC REACTION   fluticasone 50 MCG/ACT nasal spray Commonly known as: FLONASE Place 1-2 sprays into both nostrils as needed.   ibuprofen 200 MG tablet Commonly known as: ADVIL Take 400 mg by mouth every 6 (six) hours as needed. For pain   ipratropium-albuterol 0.5-2.5 (3) MG/3ML Soln Commonly known as: DUONEB USE 1 VIAL IN NEBULIZER EVERY 6 HOURS AS NEEDED   levocetirizine 5 MG tablet Commonly known as: XYZAL Take 1 tablet (5 mg total) by mouth daily as needed.   montelukast 10 MG tablet Commonly known as: SINGULAIR TAKE 1 TABLET EVERY DAY AS DIRECTED   pantoprazole 40 MG tablet Commonly known as: PROTONIX Take 1 tablet (40 mg total) by mouth at bedtime. Take one tablet daily in the morning   PATANOL OP Apply to eye. Uses PRN   ProAir HFA 108 (90 Base) MCG/ACT inhaler Generic drug: albuterol INHALE 2 PUFFS BY MOUTH EVERY 4 HOURS AS NEEDED FOR WHEEZING OR SHORTNESS OF BREATH   albuterol 108 (90 Base) MCG/ACT inhaler Commonly known as: VENTOLIN HFA INHALE 2 PUFFS BY MOUTH EVERY 6 HOURS AS NEEDED FOR WHEEZINGFOR SHORTNESS OF BREATH       Past Medical History:  Diagnosis Date  . Allergy   . Asthma   . GERD (gastroesophageal reflux  disease)   . Kidney infection     Past Surgical History:  Procedure Laterality Date  . ESOPHAGEAL MANOMETRY N/A 12/15/2016   Procedure: ESOPHAGEAL MANOMETRY (EM);  Surgeon: Iva Boop, MD;  Location: [facility];  Service: Endoscopy;  Laterality: N/A;  . ESOPHAGOGASTRODUODENOSCOPY (EGD) WITH ESOPHAGEAL DILATION  08/30/2016  . MOUTH SURGERY    . PH IMPEDANCE STUDY N/A 12/15/2016   Procedure: PH IMPEDANCE STUDY;  Surgeon: Iva Boop, MD;  Location: [facility];  Service: Endoscopy;  Laterality: N/A;    Review of systems negative except as noted in HPI / PMHx or  noted below:  Review of Systems  Constitutional: Negative.   HENT: Negative.   Eyes: Negative.   Respiratory: Negative.   Cardiovascular: Negative.   Gastrointestinal: Negative.   Genitourinary: Negative.   Musculoskeletal: Negative.   Skin: Negative.   Neurological: Negative.   Endo/Heme/Allergies: Negative.   Psychiatric/Behavioral: Negative.      Objective:   Vitals:   08/12/20 1650  BP: 112/60  Pulse: 89  Resp: 16  Temp: 98.1 F (36.7 C)  SpO2: 98%   Height: 5' 3.75" (161.9 cm)  Weight: 133 lb 3.2 oz (60.4 kg)   Physical Exam Constitutional:      Appearance: She is not diaphoretic.  HENT:     Head: Normocephalic.     Right Ear: Tympanic membrane, ear canal and external ear normal.     Left Ear: Tympanic membrane, ear canal and external ear normal.     Nose: Nose normal. No mucosal edema or rhinorrhea.     Mouth/Throat:     Mouth: Oropharynx is clear and moist and mucous membranes are normal.     Pharynx: Uvula midline. No oropharyngeal exudate.  Eyes:     Conjunctiva/sclera: Conjunctivae normal.  Neck:     Thyroid: No thyromegaly.     Trachea: Trachea normal. No tracheal tenderness or tracheal deviation.  Cardiovascular:     Rate and Rhythm: Normal rate and regular rhythm.     Heart sounds: Normal heart sounds, S1 normal and S2 normal. No murmur heard.   Pulmonary:     Effort: No respiratory distress.     Breath sounds: Normal breath sounds. No stridor. No wheezing or rales.  Musculoskeletal:        General: No edema.  Lymphadenopathy:     Head:     Right side of head: No tonsillar adenopathy.     Left side of head: No tonsillar adenopathy.     Cervical: No cervical adenopathy.  Skin:    Findings: No erythema or rash.     Nails: There is no clubbing.  Neurological:     Mental Status: She is alert.     Diagnostics:    Spirometry was performed and demonstrated an FEV1 of 1.93 at 61 % of predicted.  Assessment and Plan:   1. Asthma, severe  persistent, well-controlled   2. Other allergic rhinitis   3. Food allergy   4. Migraine syndrome   5. Gastroesophageal reflux disease, unspecified whether esophagitis present      1. Continue inflammation with the following:   A. Dulera 200 - 2 inhalations 2 times a day  B. fluticasone 1-2 sprays each nostril 1-7 times a week   C. montelukast 10 mg 1 time per day   2. Evaluate and treat reflux with the following:   A. Dexilant 60 mg in AM  B. Pantoprazole 40 mg in PM  C. Revisit with GI - possible  Nissen fundoplication???  3. Continue levo cetirizine, Pataday, and albuterol MDI/nebulizer if needed  4. "Action Plan" for flare-up:   A. Add Alvesco 160 - 1 inhalation 2 times per day to [facility] [place]  B. Use albuterol if needed  5. Visit with neurologist for complicated migraine headache tomorrow  6.  Return to clinic in 12 months or earlier if problem  From an airway standpoint, Marwah appears to be doing relatively well and she will continue on anti-inflammatory agents for airway as noted above.  From a reflux standpoint she is not doing well and she needs to visit with her gastroenterologist to discuss a possible Nissen fundoplication.  She will be visiting with her neurologist tomorrow to address her complicated migraines.  Assuming she does well with the plan noted above I will see her back in his clinic in 12 months or earlier if there is a problem.   Laurette Schimke, MD Allergy / Immunology [facility]

## 2020-08-13 ENCOUNTER — Encounter: Payer: Self-pay | Admitting: Neurology

## 2020-08-13 ENCOUNTER — Telehealth: Payer: Self-pay | Admitting: Neurology

## 2020-08-13 ENCOUNTER — Encounter: Payer: Self-pay | Admitting: Allergy and Immunology

## 2020-08-13 ENCOUNTER — Ambulatory Visit: Payer: 59 | Admitting: Neurology

## 2020-08-13 VITALS — BP 94/61 | HR 78 | Ht 63.5 in | Wt 131.0 lb

## 2020-08-13 DIAGNOSIS — R519 Headache, unspecified: Secondary | ICD-10-CM

## 2020-08-13 DIAGNOSIS — G43109 Migraine with aura, not intractable, without status migrainosus: Secondary | ICD-10-CM | POA: Diagnosis not present

## 2020-08-13 DIAGNOSIS — R55 Syncope and collapse: Secondary | ICD-10-CM | POA: Diagnosis not present

## 2020-08-13 DIAGNOSIS — R404 Transient alteration of awareness: Secondary | ICD-10-CM

## 2020-08-13 MED ORDER — RIZATRIPTAN BENZOATE 10 MG PO TBDP: 10.0000 mg | ORAL_TABLET | ORAL | 11 refills | Status: DC | PRN

## 2020-08-13 NOTE — Patient Instructions (Addendum)
- MRI of the brain and EEG  - Acute management: Rizatriptan: Please take one tablet at the onset of your headache. If it does not improve the symptoms please take one additional tablet. Do not take more then 2 tablets in 24hrs. Do not take use more then 2 to 3 times in a week. She declines preventative, would like as needed sounds like she only has 2-3 migraine days a month and some other headaches, we discussed options in detail She needs a primary care doctor for routine care and possibly cardiac evaluation, I discussed with patient.  - Routine blood work today   Syncope  Syncope refers to a condition in which a person temporarily loses consciousness. Syncope may also be called fainting or passing out. It is caused by a sudden decrease in blood flow to the brain. Even though most causes of syncope are not dangerous, syncope can be a sign of a serious medical problem. Your health care provider may do tests to find the reason why you are having syncope. Signs that you may be about to faint include:  Feeling dizzy or light-headed.  Feeling nauseous.  Seeing all white or all black in your field of vision.  Having cold, clammy skin. If you faint, get medical help right away. Call your local emergency services (911 in the [place] Do not drive yourself to the hospital. Follow these instructions at home: Pay attention to any changes in your symptoms. Take these actions to stay safe and to help relieve your symptoms: Lifestyle  Do not drive, use machinery, or play sports until your health care provider says it is okay.  Do not drink alcohol.  Do not use any products that contain nicotine or tobacco, such as cigarettes and e-cigarettes. If you need help quitting, ask your health care provider.  Drink enough fluid to keep your urine pale yellow. General instructions  Take over-the-counter and prescription medicines only as told by your health care provider.  If you are taking blood pressure  or heart medicine, get up slowly and take several minutes to sit and then stand. This can reduce dizziness or light-headedness.  Have someone stay with you until you feel stable.  If you start to feel like you might faint, lie down right away and raise (elevate) your feet above the level of your heart. Breathe deeply and steadily. Wait until all the symptoms have passed.  Keep all follow-up visits as told by your health care provider. This is important. Get help right away if you:  Have a severe headache.  Faint once or repeatedly.  Have pain in your chest, abdomen, or back.  Have a very fast or irregular heartbeat (palpitations).  Have pain when you breathe.  Are bleeding from your mouth or rectum, or you have black or tarry stool.  Have a seizure.  Are confused.  Have trouble walking.  Have severe weakness.  Have vision problems. These symptoms may represent a serious problem that is an emergency. Do not wait to see if your symptoms will go away. Get medical help right away. Call your local emergency services (911 in the [place] Do not drive yourself to the hospital. Summary  Syncope refers to a condition in which a person temporarily loses consciousness. It is caused by a sudden decrease in blood flow to the brain.  Signs that you may be about to faint include dizziness, feeling light-headed, feeling nauseous, sudden vision changes, or cold, clammy skin.  Although most causes of syncope are  not dangerous, syncope can be a sign of a serious medical problem. If you faint, get medical help right away. This information is not intended to replace advice given to you by your health care provider. Make sure you discuss any questions you have with your health care provider. Document Revised: 11/08/2019 Document Reviewed: 11/08/2019 Elsevier Patient Education  2021 Elsevier Inc.  Migraine Headache A migraine headache is an intense, throbbing pain on one side or both sides of the  head. Migraine headaches may also cause other symptoms, such as nausea, vomiting, and sensitivity to light and noise. A migraine headache can last from 4 hours to 3 days. Talk with your doctor about what things may bring on (trigger) your migraine headaches. What are the causes? The exact cause of this condition is not known. However, a migraine may be caused when nerves in the brain become irritated and release chemicals that cause inflammation of blood vessels. This inflammation causes pain. This condition may be triggered or caused by:  Drinking alcohol.  Smoking.  Taking medicines, such as: ? Medicine used to treat chest pain (nitroglycerin). ? Birth control pills. ? Estrogen. ? Certain blood pressure medicines.  Eating or drinking products that contain nitrates, glutamate, aspartame, or tyramine. Aged cheeses, chocolate, or caffeine may also be triggers.  Doing physical activity. Other things that may trigger a migraine headache include:  Menstruation.  Pregnancy.  Hunger.  Stress.  Lack of sleep or too much sleep.  Weather changes.  Fatigue. What increases the risk? The following factors may make you more likely to experience migraine headaches:  Being a certain age. This condition is more common in people who are 60-63 years old.  Being female.  Having a family history of migraine headaches.  Being Caucasian.  Having a mental health condition, such as depression or anxiety.  Being obese. What are the signs or symptoms? The main symptom of this condition is pulsating or throbbing pain. This pain may:  Happen in any area of the head, such as on one side or both sides.  Interfere with daily activities.  Get worse with physical activity.  Get worse with exposure to bright lights or loud noises. Other symptoms may include:  Nausea.  Vomiting.  Dizziness.  General sensitivity to bright lights, loud noises, or smells. Before you get a migraine  headache, you may get warning signs (an aura). An aura may include:  Seeing flashing lights or having blind spots.  Seeing bright spots, halos, or zigzag lines.  Having tunnel vision or blurred vision.  Having numbness or a tingling feeling.  Having trouble talking.  Having muscle weakness. Some people have symptoms after a migraine headache (postdromal phase), such as:  Feeling tired.  Difficulty concentrating. How is this diagnosed? A migraine headache can be diagnosed based on:  Your symptoms.  A physical exam.  Tests, such as: ? CT scan or an MRI of the head. These imaging tests can help rule out other causes of headaches. ? Taking fluid from the spine (lumbar puncture) and analyzing it (cerebrospinal fluid analysis, or CSF analysis). How is this treated? This condition may be treated with medicines that:  Relieve pain.  Relieve nausea.  Prevent migraine headaches. Treatment for this condition may also include:  Acupuncture.  Lifestyle changes like avoiding foods that trigger migraine headaches.  Biofeedback.  Cognitive behavioral therapy. Follow these instructions at home: Medicines  Take over-the-counter and prescription medicines only as told by your health care provider.  Ask your health  care provider if the medicine prescribed to you: ? Requires you to avoid driving or using heavy machinery. ? Can cause constipation. You may need to take these actions to prevent or treat constipation:  Drink enough fluid to keep your urine pale yellow.  Take over-the-counter or prescription medicines.  Eat foods that are high in fiber, such as beans, whole grains, and fresh fruits and vegetables.  Limit foods that are high in fat and processed sugars, such as fried or sweet foods. Lifestyle  Do not drink alcohol.  Do not use any products that contain nicotine or tobacco, such as cigarettes, e-cigarettes, and chewing tobacco. If you need help quitting, ask your  health care provider.  Get at least 8 hours of sleep every night.  Find ways to manage stress, such as meditation, deep breathing, or yoga. General instructions  Keep a journal to find out what may trigger your migraine headaches. For example, write down: ? What you eat and drink. ? How much sleep you get. ? Any change to your diet or medicines.  If you have a migraine headache: ? Avoid things that make your symptoms worse, such as bright lights. ? It may help to lie down in a dark, quiet room. ? Do not drive or use heavy machinery. ? Ask your health care provider what activities are safe for you while you are experiencing symptoms.  Keep all follow-up visits as told by your health care provider. This is important.      Contact a health care provider if:  You develop symptoms that are different or more severe than your usual migraine headache symptoms.  You have more than 15 headache days in one month. Get help right away if:  Your migraine headache becomes severe.  Your migraine headache lasts longer than 72 hours.  You have a fever.  You have a stiff neck.  You have vision loss.  Your muscles feel weak or like you cannot control them.  You start to lose your balance often.  You have trouble walking.  You faint.  You have a seizure. Summary  A migraine headache is an intense, throbbing pain on one side or both sides of the head. Migraines may also cause other symptoms, such as nausea, vomiting, and sensitivity to light and noise.  This condition may be treated with medicines and lifestyle changes. You may also need to avoid certain things that trigger a migraine headache.  Keep a journal to find out what may trigger your migraine headaches.  Contact your health care provider if you have more than 15 headache days in a month or you develop symptoms that are different or more severe than your usual migraine headache symptoms. This information is not intended to  replace advice given to you by your health care provider. Make sure you discuss any questions you have with your health care provider. Document Revised: 10/20/2018 Document Reviewed: 08/10/2018 Elsevier Patient Education  2021 [employer].  [place], [place], [employer] with [facility] 4.87 out of 5  248 reviews [facility] [employer] [phone]

## 2020-08-13 NOTE — Telephone Encounter (Signed)
No to the covid questions MR Brain w/wo contrast Dr. Lucia Gaskins [facility] Berkley Harvey: [id number] (exp. 08/13/20 to 09/27/20). Patient is scheduled at [facility] for 08/19/20.

## 2020-08-13 NOTE — Telephone Encounter (Signed)
Pt called, need a work note for today's appt. Would like a call from the nurse.

## 2020-08-14 ENCOUNTER — Telehealth: Payer: Self-pay

## 2020-08-14 LAB — CBC WITH DIFFERENTIAL/PLATELET
Basophils Absolute: 0 10*3/uL (ref 0.0–0.2)
Basos: 1 %
EOS (ABSOLUTE): 0.2 10*3/uL (ref 0.0–0.4)
Eos: 4 %
Hematocrit: 40.5 % (ref 34.0–46.6)
Hemoglobin: 13.7 g/dL (ref 11.1–15.9)
Immature Grans (Abs): 0 10*3/uL (ref 0.0–0.1)
Immature Granulocytes: 0 %
Lymphocytes Absolute: 1.2 10*3/uL (ref 0.7–3.1)
Lymphs: 29 %
MCH: 29.5 pg (ref 26.6–33.0)
MCHC: 33.8 g/dL (ref 31.5–35.7)
MCV: 87 fL (ref 79–97)
Monocytes Absolute: 0.4 10*3/uL (ref 0.1–0.9)
Monocytes: 9 %
Neutrophils Absolute: 2.5 10*3/uL (ref 1.4–7.0)
Neutrophils: 57 %
Platelets: 234 10*3/uL (ref 150–450)
RBC: 4.65 x10E6/uL (ref 3.77–5.28)
RDW: 12.1 % (ref 11.7–15.4)
WBC: 4.2 10*3/uL (ref 3.4–10.8)

## 2020-08-14 LAB — COMPREHENSIVE METABOLIC PANEL
ALT: 13 IU/L (ref 0–32)
AST: 18 IU/L (ref 0–40)
Albumin/Globulin Ratio: 1.6 (ref 1.2–2.2)
Albumin: 4.5 g/dL (ref 3.9–5.0)
Alkaline Phosphatase: 61 IU/L (ref 44–121)
BUN/Creatinine Ratio: 15 (ref 9–23)
BUN: 13 mg/dL (ref 6–20)
Bilirubin Total: 0.3 mg/dL (ref 0.0–1.2)
CO2: 22 mmol/L (ref 20–29)
Calcium: 9.3 mg/dL (ref 8.7–10.2)
Chloride: 105 mmol/L (ref 96–106)
Creatinine, Ser: 0.87 mg/dL (ref 0.57–1.00)
GFR calc Af Amer: 109 mL/min/{1.73_m2} (ref >59)
GFR calc non Af Amer: 94 mL/min/{1.73_m2} (ref >59)
Globulin, Total: 2.9 g/dL (ref 1.5–4.5)
Glucose: 90 mg/dL (ref 65–99)
Potassium: 4.3 mmol/L (ref 3.5–5.2)
Sodium: 140 mmol/L (ref 134–144)
Total Protein: 7.4 g/dL (ref 6.0–8.5)

## 2020-08-14 LAB — TSH: TSH: 1.57 u[IU]/mL (ref 0.450–4.500)

## 2020-08-14 NOTE — Telephone Encounter (Signed)
What other inhaled steroid does her insurance company cover?

## 2020-08-14 NOTE — Telephone Encounter (Signed)
According to fomrulary flovent, arnuity and pulmicort flex are teir 1's on the fomrulary

## 2020-08-14 NOTE — Telephone Encounter (Signed)
Please advise to change as alvesco is on back order with [facility]

## 2020-08-15 NOTE — Telephone Encounter (Signed)
Please provide prescription for Pulmicort 180 -2 inhalations twice a day added to [facility] during flareup

## 2020-08-18 MED ORDER — DULERA 200-5 MCG/ACT IN AERO: 2.0000 | INHALATION_SPRAY | Freq: Two times a day (BID) | RESPIRATORY_TRACT | 5 refills | Status: DC

## 2020-08-18 MED ORDER — PULMICORT FLEXHALER 180 MCG/ACT IN AEPB: 2.0000 | INHALATION_SPRAY | Freq: Two times a day (BID) | RESPIRATORY_TRACT | 5 refills | Status: DC

## 2020-08-18 NOTE — Addendum Note (Signed)
Addended by: Ralene Muskrat on: 08/18/2020 08:37 AM   Modules accepted: Orders

## 2020-08-18 NOTE — Telephone Encounter (Signed)
Called and spoke to patient and notified her of the changes. Patient verbally expressed understanding and agreed to get her medications.

## 2020-08-19 ENCOUNTER — Ambulatory Visit: Payer: 59

## 2020-08-19 DIAGNOSIS — R55 Syncope and collapse: Secondary | ICD-10-CM | POA: Diagnosis not present

## 2020-08-19 DIAGNOSIS — R519 Headache, unspecified: Secondary | ICD-10-CM

## 2020-08-19 DIAGNOSIS — R404 Transient alteration of awareness: Secondary | ICD-10-CM

## 2020-08-19 MED ORDER — GADOBENATE DIMEGLUMINE 529 MG/ML IV SOLN
15.0000 mL | Freq: Once | INTRAVENOUS | Status: AC | PRN
  Administered 2020-08-19: 15 mL via INTRAVENOUS

## 2020-08-20 ENCOUNTER — Encounter (HOSPITAL_COMMUNITY): Payer: Self-pay

## 2020-08-20 ENCOUNTER — Ambulatory Visit (HOSPITAL_COMMUNITY)
Admission: EM | Admit: 2020-08-20 | Discharge: 2020-08-20 | Disposition: A | Discharge status: Home / Self Care | Payer: 59 | Attending: Emergency Medicine | Admitting: Emergency Medicine

## 2020-08-20 ENCOUNTER — Encounter: Payer: Self-pay | Admitting: Neurology

## 2020-08-20 ENCOUNTER — Other Ambulatory Visit: Payer: Self-pay

## 2020-08-20 ENCOUNTER — Ambulatory Visit: Payer: 59 | Admitting: Neurology

## 2020-08-20 DIAGNOSIS — R55 Syncope and collapse: Secondary | ICD-10-CM

## 2020-08-20 DIAGNOSIS — J029 Acute pharyngitis, unspecified: Secondary | ICD-10-CM

## 2020-08-20 DIAGNOSIS — J4550 Severe persistent asthma, uncomplicated: Secondary | ICD-10-CM | POA: Insufficient documentation

## 2020-08-20 DIAGNOSIS — U071 COVID-19: Secondary | ICD-10-CM | POA: Insufficient documentation

## 2020-08-20 DIAGNOSIS — R519 Headache, unspecified: Secondary | ICD-10-CM

## 2020-08-20 DIAGNOSIS — R404 Transient alteration of awareness: Secondary | ICD-10-CM

## 2020-08-20 NOTE — ED Triage Notes (Signed)
Pt in with c/o ST that started yesterday but seems to be improving today  Pt has not had medication for sxs but has been doing salt water gargles

## 2020-08-20 NOTE — ED Provider Notes (Signed)
[facility]    CSN: [id number] Arrival date & time: 08/20/20  1410      History   Chief Complaint Chief Complaint  Patient presents with  . Sore Throat    HPI Elizabeth Booker is a 25 y.o. female.   Elizabeth Booker presents with complaints of sore throat which started last night. Feels mildly improved today. No headache. No fevers. No gi symptoms. No cough or congestion. No known ill contacts. Has had covid in the past, December of 2020. Hasn't taken any medications for her symptoms.    ROS per HPI, negative if not otherwise mentioned.      Past Medical History:  Diagnosis Date  . Allergy   . Asthma   . GERD (gastroesophageal reflux disease)   . Kidney infection     Patient Active Problem List   Diagnosis Date Noted  . Near syncope 08/13/2020  . Migraine with aura and without status migrainosus, not intractable 08/13/2020  . Seasonal and perennial allergic rhinitis 08/18/2017  . Seasonal allergic conjunctivitis 08/18/2017  . Severe persistent asthma, uncomplicated 08/18/2017  . Gastroesophageal reflux disease 08/18/2017  . Food allergy 08/18/2017  . Heartburn   . Asthma with acute exacerbation 08/05/2015  . Allergic rhinoconjunctivitis 08/05/2015  . Acute sinusitis 08/05/2015    Past Surgical History:  Procedure Laterality Date  . ESOPHAGEAL MANOMETRY N/A 12/15/2016   Procedure: ESOPHAGEAL MANOMETRY (EM);  Surgeon: Iva Boop, MD;  Location: [facility];  Service: Endoscopy;  Laterality: N/A;  . ESOPHAGOGASTRODUODENOSCOPY (EGD) WITH ESOPHAGEAL DILATION  08/30/2016  . MOUTH SURGERY    . PH IMPEDANCE STUDY N/A 12/15/2016   Procedure: PH IMPEDANCE STUDY;  Surgeon: Iva Boop, MD;  Location: [facility];  Service: Endoscopy;  Laterality: N/A;    OB History   No obstetric history on file.      Home Medications    Prior to Admission medications   Medication Sig Start Date End Date Taking? Authorizing Provider  albuterol (VENTOLIN  HFA) 108 (90 Base) MCG/ACT inhaler INHALE 2 PUFFS BY MOUTH EVERY 6 HOURS AS NEEDED FOR WHEEZINGFOR SHORTNESS OF BREATH 08/12/20   Kozlow, Alvira Philips, MD  budesonide (PULMICORT FLEXHALER) 180 MCG/ACT inhaler Inhale 2 puffs into the lungs in the morning and at bedtime. 08/18/20   Kozlow, Alvira Philips, MD  ciclesonide (ALVESCO) 160 MCG/ACT inhaler Inhale 1 puff into the lungs 2 (two) times daily. 08/12/20   Kozlow, Alvira Philips, MD  dexlansoprazole (DEXILANT) 60 MG capsule Take 1 capsule (60 mg total) by mouth every morning. 08/12/20   Kozlow, Alvira Philips, MD  EPINEPHRINE 0.3 mg/0.3 mL IJ SOAJ injection INJECT CONTENTS OF 1 PEN AS NEEDED FOR LIFE-THREATENING ALLERGIC REACTION 08/06/19   Kozlow, Alvira Philips, MD  famotidine (PEPCID) 40 MG tablet Take 1 tablet daily in the afternoon as needed 02/05/20   Kozlow, Alvira Philips, MD  fluticasone (FLONASE) 50 MCG/ACT nasal spray Place 1-2 sprays into both nostrils as needed. 08/12/20   Kozlow, Alvira Philips, MD  Fluticasone-Salmeterol,sensor, [facility]) [phone] MCG/ACT AEPB Inhale 2 puffs into the lungs in the morning and at bedtime. 10/01/19   Kozlow, Alvira Philips, MD  ibuprofen (ADVIL,MOTRIN) 200 MG tablet Take 400 mg by mouth every 6 (six) hours as needed. For pain    [provider]  ipratropium-albuterol (DUONEB) 0.5-2.5 (3) MG/3ML SOLN USE 1 VIAL IN NEBULIZER EVERY 6 HOURS AS NEEDED 08/12/20   Kozlow, Alvira Philips, MD  levocetirizine (XYZAL) 5 MG tablet Take 1 tablet (5 mg  total) by mouth daily as needed. 08/12/20   Kozlow, Alvira Philips, MD  mometasone-formoterol (DULERA) 200-5 MCG/ACT AERO Inhale 2 puffs into the lungs 2 (two) times daily. 08/18/20   Kozlow, Alvira Philips, MD  montelukast (SINGULAIR) 10 MG tablet TAKE 1 TABLET EVERY DAY AS DIRECTED 08/12/20   Kozlow, Alvira Philips, MD  Olopatadine HCl (PATANOL OP) Apply to eye. Uses PRN    [provider]  pantoprazole (PROTONIX) 40 MG tablet Take 1 tablet (40 mg total) by mouth at bedtime. Take one tablet daily in the morning 08/12/20   Kozlow, Alvira Philips, MD  PROAIR HFA 108  [phone] Base) MCG/ACT inhaler INHALE 2 PUFFS BY MOUTH EVERY 4 HOURS AS NEEDED FOR WHEEZING OR SHORTNESS OF BREATH 08/12/20   Kozlow, Alvira Philips, MD  QVAR REDIHALER 80 MCG/ACT inhaler INHALE 2 PUFFS BY MOUTH TWICE DAILY WITH ASTHMA FLAREUP 07/10/18   Kozlow, Alvira Philips, MD  rizatriptan (MAXALT-MLT) 10 MG disintegrating tablet Take 1 tablet (10 mg total) by mouth as needed for migraine. May repeat in 2 hours if needed 08/13/20   Anson Fret, MD    Family History Family History  Problem Relation Age of Onset  . High blood pressure Mother   . High blood pressure Father   . Schizophrenia Paternal Grandfather   . Colon cancer Neg Hx   . Esophageal cancer Neg Hx   . Rectal cancer Neg Hx   . Stomach cancer Neg Hx   . Allergic rhinitis Neg Hx   . Angioedema Neg Hx   . Asthma Neg Hx   . Atopy Neg Hx   . Eczema Neg Hx   . Immunodeficiency Neg Hx   . Urticaria Neg Hx   . Migraines Neg Hx     Social History Social History   Tobacco Use  . Smoking status: Never Smoker  . Smokeless tobacco: Never Used  Vaping Use  . Vaping Use: Never used  Substance Use Topics  . Alcohol use: No    Alcohol/week: 0.0 standard drinks  . Drug use: No     Allergies   Iohexol   Review of Systems Review of Systems   Physical Exam Triage Vital Signs ED Triage Vitals  Enc Vitals Group     BP 08/20/20 1432 120/78     Pulse Rate 08/20/20 1432 89     Resp 08/20/20 1432 17     Temp 08/20/20 1432 99.1 F (37.3 C)     Temp src --      SpO2 08/20/20 1432 98 %     Weight --      Height --      Head Circumference --      Peak Flow --      Pain Score 08/20/20 1430 4     Pain Loc --      Pain Edu? --      Excl. in GC? --    No data found.  Updated Vital Signs BP 120/78   Pulse 89   Temp 99.1 F (37.3 C)   Resp 17   LMP 08/15/2020 (Exact Date)   SpO2 98%   Visual Acuity Right Eye Distance:   Left Eye Distance:   Bilateral Distance:    Right Eye Near:   Left Eye Near:    Bilateral Near:      Physical Exam Constitutional:      General: She is not in acute distress.    Appearance: She is well-developed.  HENT:  Mouth/Throat:     Mouth: Mucous membranes are moist. No oral lesions.     Pharynx: No pharyngeal swelling, oropharyngeal exudate or posterior oropharyngeal erythema.     Tonsils: No tonsillar exudate. 1+ on the right. 1+ on the left.  Cardiovascular:     Rate and Rhythm: Normal rate.  Pulmonary:     Effort: Pulmonary effort is normal.  Skin:    General: Skin is warm and dry.  Neurological:     Mental Status: She is alert and oriented to person, place, and time.      UC Treatments / Results  Labs (all labs ordered are listed, but only abnormal results are displayed) Labs Reviewed  SARS CORONAVIRUS 2 (TAT 6-24 HRS)    EKG   Radiology No results found.  Procedures Procedures (including critical care time)  Medications Ordered in UC Medications - No data to display  Initial Impression / Assessment and Plan / UC Course  I have reviewed the triage vital signs and the nursing notes.  Pertinent labs & imaging results that were available during my care of the patient were reviewed by me and considered in my medical decision making (see chart for details).     Non toxic. Benign physical exam.  History and physical consistent with viral illness.  Covid testing pending and isolation instructions provided.  Supportive cares recommended. Return precautions provided. Patient verbalized understanding and agreeable to plan.   Final Clinical Impressions(s) / UC Diagnoses   Final diagnoses:  Pharyngitis, unspecified etiology     Discharge Instructions     Self isolate until covid results are back.  We will notify you by phone if it is positive. Your negative results will be sent through your MyChart.    If it is positive you need to isolate from others for a total of 5 days. If no fever for 24 hours without medications, and symptoms improving you may  end isolation on day 6, but wear a mask if around any others for an additional 5 days.   Throat lozenges, gargles, chloraseptic spray, warm teas, popsicles etc to help with throat pain.   Tylenol and/or ibuprofen as needed for pain or fevers.  If symptoms worsen or do not improve in the next week to return to be seen or to follow up with your PCP.     ED Prescriptions    None     PDMP not reviewed this encounter.   Georgetta Haber, NP 08/20/20 1459

## 2020-08-20 NOTE — Discharge Instructions (Signed)
Self isolate until covid results are back.  We will notify you by phone if it is positive. Your negative results will be sent through your MyChart.    If it is positive you need to isolate from others for a total of 5 days. If no fever for 24 hours without medications, and symptoms improving you may end isolation on day 6, but wear a mask if around any others for an additional 5 days.   Throat lozenges, gargles, chloraseptic spray, warm teas, popsicles etc to help with throat pain.   Tylenol and/or ibuprofen as needed for pain or fevers.  If symptoms worsen or do not improve in the next week to return to be seen or to follow up with your PCP.

## 2020-08-21 LAB — SARS CORONAVIRUS 2 (TAT 6-24 HRS): SARS Coronavirus 2: POSITIVE — AB

## 2020-08-22 ENCOUNTER — Telehealth: Payer: Self-pay | Admitting: Family

## 2020-08-22 NOTE — Telephone Encounter (Signed)
Called to discuss with patient about COVID-19 symptoms and the use of one of the available treatments for those with mild to moderate Covid symptoms and at a high risk of hospitalization.  Pt appears to qualify for outpatient treatment due to co-morbid conditions and/or a member of an at-risk group in accordance with the FDA Emergency Use Authorization.    Symptom onset: 08/19/20 Vaccinated: No Booster? No Immunocompromised? No Qualifiers: Asthma   Called to discuss treatment options for Covid and was unable to reach Ms. Finnigan via phone. Voicemail message was left with call back number and MyChart message sent.   Marcos Eke, NP 08/22/2020 11:34 AM

## 2020-08-25 NOTE — Procedures (Signed)
   HISTORY: 25 year old female presented with frequent headaches, passing out spells  TECHNIQUE:  This is a routine 16 channel EEG recording with one channel devoted to a limited EKG recording.  It was performed during wakefulness, drowsiness and asleep.  Hyperventilation and photic stimulation were performed as activating procedures.  There are minimum muscle and movement artifact noted.  Upon maximum arousal, posterior dominant waking rhythm consistent of rhythmic alpha range activity, with frequency of 8 hz. Activities are symmetric over the bilateral posterior derivations and attenuated with eye opening.  Hyperventilation produced mild/moderate buildup with higher amplitude and the slower activities noted.  Photic stimulation did not alter the tracing.  During EEG recording, patient developed drowsiness and no deeper stage of sleep was achieved During EEG recording, there was no epileptiform discharge noted.  EKG demonstrate sinus rhythm, with heart rate of 80 bpm  CONCLUSION: This is a  normal awake EEG.  There is no electrodiagnostic evidence of epileptiform discharge.  Levert Feinstein, M.D. Ph.D.  [facility] [address] [place], [place] [address] Phone: [phone] Fax:      [phone]

## 2020-08-26 NOTE — Telephone Encounter (Signed)
I called the patient to follow up on her getting scheduled for [facility]. I didn't see an appointment scheduled for the patient in epic.  I left a detailed voicemail for the patient.

## 2020-08-27 ENCOUNTER — Encounter: Payer: Self-pay | Admitting: Internal Medicine

## 2020-08-27 NOTE — Telephone Encounter (Signed)
Patient is scheduled with Dr. Leone Payor at [facility] on 10-15-2020. Patient wanted Dr.Kozlow to know that she had a MRI done 08-20-2020, which came back normal, just migraines.

## 2020-09-16 ENCOUNTER — Other Ambulatory Visit: Payer: Self-pay | Admitting: *Deleted

## 2020-09-16 MED ORDER — PANTOPRAZOLE SODIUM 40 MG PO TBEC: 40.0000 mg | DELAYED_RELEASE_TABLET | Freq: Every day | ORAL | 11 refills | Status: DC

## 2020-10-15 ENCOUNTER — Encounter: Payer: Self-pay | Admitting: Internal Medicine

## 2020-10-15 ENCOUNTER — Other Ambulatory Visit: Payer: Self-pay

## 2020-10-15 ENCOUNTER — Ambulatory Visit: Payer: 59 | Admitting: Internal Medicine

## 2020-10-15 VITALS — BP 96/58 | HR 74 | Ht 63.5 in | Wt 130.4 lb

## 2020-10-15 DIAGNOSIS — R12 Heartburn: Secondary | ICD-10-CM | POA: Diagnosis not present

## 2020-10-15 DIAGNOSIS — R1310 Dysphagia, unspecified: Secondary | ICD-10-CM

## 2020-10-15 DIAGNOSIS — K219 Gastro-esophageal reflux disease without esophagitis: Secondary | ICD-10-CM

## 2020-10-15 DIAGNOSIS — G43909 Migraine, unspecified, not intractable, without status migrainosus: Secondary | ICD-10-CM | POA: Insufficient documentation

## 2020-10-15 NOTE — Patient Instructions (Signed)
You have been scheduled for a Barium Esophogram at [facility] on 10/21/20 at 10:00am. Please arrive 15 minutes prior to your appointment for registration. Make certain not to have anything to eat or drink 3 hours prior to your test. If you need to reschedule for any reason, please contact radiology at [phone] to do so. __________________________________________________________________ A barium swallow is an examination that concentrates on views of the esophagus. This tends to be a double contrast exam (barium and two liquids which, when combined, create a gas to distend the wall of the oesophagus) or single contrast (non-ionic iodine based). The study is usually tailored to your symptoms so a good history is essential. Attention is paid during the study to the form, structure and configuration of the esophagus, looking for functional disorders (such as aspiration, dysphagia, achalasia, motility and reflux) EXAMINATION You may be asked to change into a gown, depending on the type of swallow being performed. A radiologist and radiographer will perform the procedure. The radiologist will advise you of the type of contrast selected for your procedure and direct you during the exam. You will be asked to stand, sit or lie in several different positions and to hold a small amount of fluid in your mouth before being asked to swallow while the imaging is performed .In some instances you may be asked to swallow barium coated marshmallows to assess the motility of a solid food bolus. The exam can be recorded as a digital or video fluoroscopy procedure. POST PROCEDURE It will take 1-2 days for the barium to pass through your system. To facilitate this, it is important, unless otherwise directed, to increase your fluids for the next 24-48hrs and to resume your normal diet.  This test typically takes about 30 minutes to  perform. __________________________________________________________________________________   Elizabeth Booker have been scheduled for an endoscopy. Please follow written instructions given to you at your visit today. If you use inhalers (even only as needed), please bring them with you on the day of your procedure.  Due to recent changes in healthcare laws, you may see the results of your imaging and laboratory studies on MyChart before your provider has had a chance to review them.  We understand that in some cases there may be results that are confusing or concerning to you. Not all laboratory results come back in the same time frame and the provider may be waiting for multiple results in order to interpret others.  Please give [place] 48 hours in order for your provider to thoroughly review all the results before contacting the office for clarification of your results.   We have given you a TIF handout to read today.   I appreciate the opportunity to care for you. Stan Head, MD, [facility]

## 2020-10-15 NOTE — Progress Notes (Signed)
Elizabeth Booker 24 y.o. 10-25-1995 [id number] Referred by: Jessica Priest, MD  Assessment & Plan:   Encounter Diagnoses  Name Primary?  . Odynophagia Yes  . Heartburn and regurgitation   . Gastroesophageal reflux disease without esophagitis     She has persistent problems on very maximal medical therapy for GERD.  She mainly has regurgitation despite this maximal acid therapy would suggest increased transient lower esophageal sphincter relaxation's.  She had a normal esophageal manometry in 2018.  Though she did have a normal number of reflux episodes the symptom correlation was with weakly acidic reflux on the impedance probe.  She may be 1 of these patients that has more of a regurgitation problem that is not adequately controlled by acid reflux.  She could be a good candidate for the transoral incision less fundoplication (TIF), perhaps.  I need to work her up again looking for changes with the odynophagia and her asthma treatment she could have candidiasis.  I think taking esophageal biopsies is reasonable though probably will be of low yield perhaps she has some atypical eosinophilic esophagitis.  I am also going to have her do a barium swallow with tablet for additional information.   The studies will be done on her maximal medical therapy but most likely will plan to reduce that by dropping the nocturnal pantoprazole.  I think we could consider some empiric metoclopramide perhaps.  She is interested in trying to reduce medications which I support which is where the TIF could come in if she is a suitable candidate.  I have given her a handout about that as well.  The risks and benefits as well as alternatives of endoscopic procedure(s) have been discussed and reviewed. All questions answered. The patient agrees to proceed.  The other thing to bear in mind is she could have functional heartburn which is not acid or actual reflux mediated though her regurgitation history goes  against that.   I appreciate the opportunity to care for this patient. CC to Dr. Laurette Schimke    Subjective:   Chief Complaint: Reflux  HPI Elizabeth Booker is a 25 year old married white woman known to me with previous reflux symptomatology and globus sensation and dysphagia with previous normal-appearing EGD, normal esophageal manometry and normal 24-hour pH impedance study with weakly acidic reflux episodes though within the range of normal frequency back in 2018.  She was last seen by me in March 2018 when those manometric and impedance studies were set up.  She had undergone an empiric 32 French Maloney dilation without benefit.  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  She describes cycles of regurgitation and reflux of fluid and asthma flares that are related.  A vicious cycle.  Her asthma is being treated with multiple medications including rescue albuterol, inhaled steroids and inhaled chronic beta agonist, inhaled DuoNeb as needed Xyzal as needed and mepolizumab monthly.  Last visit with Dr. Lucie Leather was in February, he had increased her from Dexilant 60 mg daily and what I think was an H2 blocker at night to Dexilant 60 mg daily and pantoprazole at night.  She does not think this is helped her and she is having a lot of regurgitation still.  Her asthma was doing well at that time.  She also complains of painful swallowing i.e. odynophagia.  In addition she has food allergies to cucumber extract mango flavored fruit onion cantaloupe  most vegetables and fruits and watermelon concentrate.  She carries an EpiPen.   She has also been seen by neurology recently for migraine headaches and near syncope thought to be vasovagal.  MRI of the brain was normal.  As was an awake EEG.  2018 EGD results - Benign-appearing esophageal stenosis. Dilated. - 1 cm hiatal hernia and associated irregular z-line - Erythematous  mucosa in the cardia. - The examination was otherwise normal. No endoscopic signs of eosinophilic esophagitis - No specimens collected. Allergies  Allergen Reactions  . Iohexol Swelling    Throat swelling oral , per Dr. Fredirick Lathe per Pre meds with IV contrast or do scan without //rls   Current Meds  Medication Sig  . albuterol (VENTOLIN HFA) 108 (90 Base) MCG/ACT inhaler INHALE 2 PUFFS BY MOUTH EVERY 6 HOURS AS NEEDED FOR WHEEZINGFOR SHORTNESS OF BREATH  . budesonide (PULMICORT FLEXHALER) 180 MCG/ACT inhaler Inhale 2 puffs into the lungs in the morning and at bedtime.  . ciclesonide (ALVESCO) 160 MCG/ACT inhaler Inhale 1 puff into the lungs 2 (two) times daily.  Marland Kitchen dexlansoprazole (DEXILANT) 60 MG capsule Take 1 capsule (60 mg total) by mouth every morning.  Marland Kitchen EPINEPHRINE 0.3 mg/0.3 mL IJ SOAJ injection INJECT CONTENTS OF 1 PEN AS NEEDED FOR LIFE-THREATENING ALLERGIC REACTION  . fluticasone (FLONASE) 50 MCG/ACT nasal spray Place 1-2 sprays into both nostrils as needed.  . Fluticasone-Salmeterol,sensor, (AIRDUO DIGIHALER) 113-14 MCG/ACT AEPB Inhale 2 puffs into the lungs in the morning and at bedtime.  Marland Kitchen ibuprofen (ADVIL,MOTRIN) 200 MG tablet Take 400 mg by mouth every 6 (six) hours as needed. For pain  . ipratropium-albuterol (DUONEB) 0.5-2.5 (3) MG/3ML SOLN USE 1 VIAL IN NEBULIZER EVERY 6 HOURS AS NEEDED  . levocetirizine (XYZAL) 5 MG tablet Take 1 tablet (5 mg total) by mouth daily as needed.  . mometasone-formoterol (DULERA) 200-5 MCG/ACT AERO Inhale 2 puffs into the lungs 2 (two) times daily.  . montelukast (SINGULAIR) 10 MG tablet TAKE 1 TABLET EVERY DAY AS DIRECTED  . Olopatadine HCl (PATANOL OP) Apply to eye. Uses PRN  . pantoprazole (PROTONIX) 40 MG tablet Take 1 tablet (40 mg total) by mouth at bedtime.  Marland Kitchen PROAIR HFA 108 (90 Base) MCG/ACT inhaler INHALE 2 PUFFS BY MOUTH EVERY 4 HOURS AS NEEDED FOR WHEEZING OR SHORTNESS OF BREATH  . QVAR REDIHALER 80 MCG/ACT inhaler INHALE 2 PUFFS BY  MOUTH TWICE DAILY WITH ASTHMA FLAREUP  . rizatriptan (MAXALT-MLT) 10 MG disintegrating tablet Take 1 tablet (10 mg total) by mouth as needed for migraine. May repeat in 2 hours if needed   Current Facility-Administered Medications for the 10/15/20 encounter (Office Visit) with Iva Boop, MD  Medication  . Mepolizumab SOLR 100 mg   Past Medical History:  Diagnosis Date  . Allergy   . Asthma   . GERD (gastroesophageal reflux disease)   . Kidney infection   . Migraines   . Vasovagal near-syncope    Past Surgical History:  Procedure Laterality Date  . ESOPHAGEAL MANOMETRY N/A 12/15/2016   Procedure: ESOPHAGEAL MANOMETRY (EM);  Surgeon: Iva Boop, MD;  Location: [facility];  Service: Endoscopy;  Laterality: N/A;  . ESOPHAGOGASTRODUODENOSCOPY (EGD) WITH ESOPHAGEAL DILATION  08/30/2016  . MOUTH SURGERY    . PH IMPEDANCE STUDY N/A 12/15/2016   Procedure: PH IMPEDANCE STUDY;  Surgeon: Iva Boop, MD;  Location: [facility];  Service: Endoscopy;  Laterality: N/A;  . 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   Review of Systems As per HPI all other review of systems are negative  Objective:   Physical Exam @BP  (!) 96/58 (BP Location: Left Arm, Patient Position: Sitting, Cuff Size: Normal)   Pulse 74   Ht 5' 3.5" (1.613 m)   Wt 130 lb 6 oz (59.1 kg)   BMI 22.73 kg/m @  General:  Well-developed, well-nourished and in no acute distress Eyes:  anicteric. ENT:   Mouth and posterior pharynx free of lesions.  Lungs: Clear to auscultation  bilaterally. Heart:   S1S2, no rubs, murmurs, gallops. Abdomen:  soft, non-tender, no hepatosplenomegaly, hernia, or mass and BS+.  Extremities:   no edema, cyanosis or clubbing Skin   no rash. Neuro:  A&O x 3.  Psych:  appropriate mood and  Affect.   Data Reviewed: See HPI but I have reviewed previous GI work-up and all procedure reports related, and Dr. and Dr. Lucie Leather allergy and neurology notes from this year

## 2020-10-20 ENCOUNTER — Telehealth: Payer: Self-pay | Admitting: Internal Medicine

## 2020-10-20 NOTE — Telephone Encounter (Signed)
Inbound call from patient. Cancel procedure on 10/23/20 due to unable to get off work. Rescheduled for 12/25/20

## 2020-10-21 ENCOUNTER — Ambulatory Visit (HOSPITAL_COMMUNITY): Payer: 59

## 2020-10-23 ENCOUNTER — Encounter: Payer: 59 | Admitting: Internal Medicine

## 2020-11-13 ENCOUNTER — Other Ambulatory Visit: Payer: Self-pay | Admitting: Allergy and Immunology

## 2020-11-25 ENCOUNTER — Telehealth: Payer: Self-pay | Admitting: Allergy and Immunology

## 2020-11-25 NOTE — Telephone Encounter (Signed)
Patient states she received text message from pharmacy stating insurance does not cover her dexilant. Patient is requesting a prior authorization.   Please advise.

## 2020-11-25 NOTE — Telephone Encounter (Signed)
Pa submitted thru cover my meds key [id number]

## 2020-11-28 NOTE — Telephone Encounter (Signed)
Please advise on what else patient can try.

## 2020-11-28 NOTE — Telephone Encounter (Signed)
Patient called and said the prior authorization was denied. She has been out of this medication for two weeks and her reflux is bad. She said she was told she had to try 2-3 other medications. She said she has tried a few and nothing works like [employer].

## 2020-11-28 NOTE — Telephone Encounter (Signed)
Submitted appeal. Waiting on response from insurance pt has tried and failed, ranitidine, omeprazole, pepcid, and protonix.

## 2020-12-04 NOTE — Telephone Encounter (Signed)
Please help Elizabeth Booker with this issue.  What is the status of her insurance request?  She can use pantoprazole 40 mg 1 time per day until we get that insurance response back.

## 2020-12-04 NOTE — Telephone Encounter (Signed)
Patient called back today to follow up on dexilant prescription. Patient stated she would receive a call back but never did. Patient is requesting for someone to follow up with the insurance so she can get her dexilant. Patient did ask if we had any samples of dexilant, unfortunately we do not have any at the moment. Patient asked if she could get a sample of something else until she can get her dexilant. Patient states she has been out of her medication for the last month.  Best contact number:[phone]  Please advise.

## 2020-12-05 NOTE — Telephone Encounter (Signed)
Called and informed patient patient of denial. Patient stated that she is currently taking the pantoprazole and still suffering although she is using daily. Patient expressed that she is going to see her GI specialist in the next couple of weeks and they are considering surgery and she hopes to this issues resolved with the procedure.  Authorization Alt Options per Cover My Meds: omeprazole, pantoprazole,rabeprazole, OTC: Nexium, Prilosec,Prevacid, Zegerid

## 2020-12-09 NOTE — Telephone Encounter (Signed)
I have submitted another PA through CoverMyMeds since patient is now trying pantoprazole and has yet to have any relief. Now waiting on approval or denial. Key: [id number] - PA Case ID: [id number]

## 2020-12-11 ENCOUNTER — Ambulatory Visit: Payer: 59 | Admitting: Family Medicine

## 2020-12-11 NOTE — Telephone Encounter (Signed)
Patient called for an update on her Dexilant. I informed patient that the PA was approved and that she could pick up her medication from the pharmacy. I called [facility] and asked them to fill the medication for patient. [facility], so that when patient comes for the medication it'll be ready.

## 2020-12-17 ENCOUNTER — Ambulatory Visit (HOSPITAL_COMMUNITY)
Admission: RE | Admit: 2020-12-17 | Discharge: 2020-12-17 | Disposition: A | Discharge status: Home / Self Care | Payer: 59 | Source: Ambulatory Visit | Attending: Internal Medicine | Admitting: Internal Medicine

## 2020-12-17 ENCOUNTER — Other Ambulatory Visit: Payer: Self-pay

## 2020-12-17 DIAGNOSIS — R12 Heartburn: Secondary | ICD-10-CM

## 2020-12-17 DIAGNOSIS — R1310 Dysphagia, unspecified: Secondary | ICD-10-CM | POA: Insufficient documentation

## 2020-12-17 DIAGNOSIS — K219 Gastro-esophageal reflux disease without esophagitis: Secondary | ICD-10-CM | POA: Diagnosis present

## 2020-12-22 ENCOUNTER — Encounter: Payer: Self-pay | Admitting: Internal Medicine

## 2020-12-24 ENCOUNTER — Encounter: Payer: Self-pay | Admitting: Certified Registered Nurse Anesthetist

## 2020-12-25 ENCOUNTER — Other Ambulatory Visit: Payer: Self-pay

## 2020-12-25 ENCOUNTER — Encounter: Payer: Self-pay | Admitting: Internal Medicine

## 2020-12-25 ENCOUNTER — Ambulatory Visit (AMBULATORY_SURGERY_CENTER): Payer: 59 | Admitting: Internal Medicine

## 2020-12-25 VITALS — BP 117/55 | HR 79 | Temp 96.8°F | Resp 13 | Ht 63.5 in | Wt 130.0 lb

## 2020-12-25 DIAGNOSIS — R1310 Dysphagia, unspecified: Secondary | ICD-10-CM | POA: Diagnosis not present

## 2020-12-25 DIAGNOSIS — K319 Disease of stomach and duodenum, unspecified: Secondary | ICD-10-CM

## 2020-12-25 MED ORDER — SODIUM CHLORIDE 0.9 % IV SOLN: 500.0000 mL | Freq: Once | INTRAVENOUS | Status: DC

## 2020-12-25 NOTE — Op Note (Addendum)
[facility] Patient Name: Elizabeth PhoKaitlyn Kosa Procedure Date: 12/25/2020 10:28 AM MRN: [id number] Endoscopist: Iva Booparl E Naileah Karg , MD Age: 25 Referring MD:  Date of Birth: 04-29-1996 Gender: Female Account #: [id number] Procedure:                Upper GI endoscopy Indications:              Odynophagia Medicines:                Propofol per Anesthesia, Monitored Anesthesia Care Procedure:                Pre-Anesthesia Assessment:                           - Prior to the procedure, a History and Physical                            was performed, and patient medications and                            allergies were reviewed. The patient's tolerance of                            previous anesthesia was also reviewed. The risks                            and benefits of the procedure and the sedation                            options and risks were discussed with the patient.                            All questions were answered, and informed consent                            was obtained. Prior Anticoagulants: The patient has                            taken no previous anticoagulant or antiplatelet                            agents. ASA Grade Assessment: II - A patient with                            mild systemic disease. After reviewing the risks                            and benefits, the patient was deemed in                            satisfactory condition to undergo the procedure.                           After obtaining informed consent, the endoscope was  passed under direct vision. Throughout the                            procedure, the patient's blood pressure, pulse, and                            oxygen saturations were monitored continuously. The                            Endoscope was introduced through the mouth, and                            advanced to the second part of duodenum. The upper                            GI endoscopy was  accomplished without difficulty.                            The patient tolerated the procedure well. Scope In: Scope Out: Findings:                 The examined esophagus was normal. Biopsies were                            taken with a cold forceps for histology.                            Verification of patient identification for the                            specimen was done. Estimated blood loss was minimal.                           The gastroesophageal flap valve was visualized                            endoscopically and classified as Hill Grade III                            (minimal fold, loose to endoscope, hiatal hernia                            likely).                           The entire examined stomach was normal. Biopsies                            were taken with a cold forceps for histology.                            Verification of patient identification for the                            specimen was done. Estimated  blood loss was minimal.                           The cardia and gastric fundus were normal on                            retroflexion.                           The examined duodenum was normal. Complications:            No immediate complications. Estimated Blood Loss:     Estimated blood loss was minimal. Impression:               - Normal esophagus. Biopsied. Distal bottle and                            mid+ proximal bottle Z-line at 39 cm                           - Gastroesophageal flap valve classified as Hill                            Grade III (minimal fold, loose to endoscope, hiatal                            hernia likely).                           - Normal stomach. Biopsied.                           - Normal examined duodenum. Recommendation:           - Patient has a contact number available for                            emergencies. The signs and symptoms of potential                            delayed complications were  discussed with the                            patient. Return to normal activities tomorrow.                            Written discharge instructions were provided to the                            patient.                           - Resume previous diet.                           - Continue present medications.                           -  Await pathology results.                           - May need additional functional testing                            (manometry, pH/impedance) Iva Boop, MD 12/25/2020 10:47:51 AM This report has been signed electronically.

## 2020-12-25 NOTE — Progress Notes (Signed)
Called to room to assist during endoscopic procedure.  Patient ID and intended procedure confirmed with present staff. Received instructions for my participation in the procedure from the performing physician.  

## 2020-12-25 NOTE — Progress Notes (Signed)
Report given to PACU, vss 

## 2020-12-25 NOTE — Progress Notes (Signed)
1029 Robinul 0.1 mg IV given due large amount of secretions upon assessment.  MD made aware, vss 

## 2020-12-25 NOTE — Patient Instructions (Addendum)
I saw the tiny hiatal hernia that was seen on the barium swallow. No significant problems or abnormalities seen but I took biopsies and will be in touch.  I appreciate the opportunity to care for you. Iva Boop, MD, FACG  YOU HAD AN ENDOSCOPIC PROCEDURE TODAY AT THE [facility]:   Refer to the procedure report that was given to you for any specific questions about what was found during the examination.  If the procedure report does not answer your questions, please call your gastroenterologist to clarify.  If you requested that your care partner not be given the details of your procedure findings, then the procedure report has been included in a sealed envelope for you to review at your convenience later.  YOU SHOULD EXPECT: Some feelings of bloating in the abdomen. Passage of more gas than usual.  Walking can help get rid of the air that was put into your GI tract during the procedure and reduce the bloating.   Please Note:  You might notice some irritation and congestion in your nose or some drainage.  This is from the oxygen used during your procedure.  There is no need for concern and it should clear up in a day or so.  SYMPTOMS TO REPORT IMMEDIATELY:  Following upper endoscopy (EGD)  Vomiting of blood or coffee ground material  New chest pain or pain under the shoulder blades  Painful or persistently difficult swallowing  New shortness of breath  Fever of 100F or higher  Black, tarry-looking stools  For urgent or emergent issues, a gastroenterologist can be reached at any hour by calling [phone]. Do not use MyChart messaging for urgent concerns.    DIET:  We do recommend a small meal at first, but then you may proceed to your regular diet.  Drink plenty of fluids but you should avoid alcoholic beverages for 24 hours.  ACTIVITY:  You should plan to take it easy for the rest of today and you should NOT DRIVE or use heavy machinery until tomorrow (because of  the sedation medicines used during the test).    FOLLOW UP: Our staff will call the number listed on your records Monday morning between 7:15 am and 8:15 am following your procedure to check on you and address any questions or concerns that you may have regarding the information given to you following your procedure. If we do not reach you, we will leave a message.  We will attempt to reach you two times.  During this call, we will ask if you have developed any symptoms of COVID 19. If you develop any symptoms (ie: fever, flu-like symptoms, shortness of breath, cough etc.) before then, please call [phone].  If you test positive for Covid 19 in the 2 weeks post procedure, please call and report this information to [place].    If any biopsies were taken you will be contacted by phone or by letter within the next 1-3 weeks.  Please call us at [phone] if you have not heard about the biopsies in 3 weeks.    SIGNATURES/CONFIDENTIALITY: You and/or your care partner have signed paperwork which will be entered into your electronic medical record.  These signatures attest to the fact that that the information above on your After Visit Summary has been reviewed and is understood.  Full responsibility of the confidentiality of this discharge information lies with you and/or your care-partner.

## 2020-12-26 ENCOUNTER — Telehealth: Payer: Self-pay | Admitting: Internal Medicine

## 2020-12-26 NOTE — Telephone Encounter (Signed)
Inbound call from patient. Had EDG 6/16. States she is having chest pain and shortness of breath. Best contact [phone]

## 2020-12-26 NOTE — Telephone Encounter (Signed)
Called patient back, she stated that she is having chest pain 4/10 currently but this had improved since yesterday evening. She also complained of painful swallowing. I spoke with her doctor about this and he recommended her following a liquid diet and to advance as tolerated. I explained this to her and she agreed. She felt better after having her symptoms discussed with her MD. No further questions.

## 2020-12-29 ENCOUNTER — Telehealth: Payer: Self-pay | Admitting: *Deleted

## 2020-12-29 NOTE — Telephone Encounter (Signed)
Unable to leave message on f/u call,voicemail not set up yet

## 2020-12-29 NOTE — Telephone Encounter (Signed)
  Follow up Call-  Call back number 12/25/2020  Post procedure Call Back phone  # [phone]  Permission to leave phone message Yes  Some recent data might be hidden     No answer at 2nd attempt follow up phone call.  Left message on voicemail.

## 2021-02-02 ENCOUNTER — Ambulatory Visit (HOSPITAL_COMMUNITY): Admission: RE | Admit: 2021-02-02 | Payer: 59 | Source: Home / Self Care | Admitting: Internal Medicine

## 2021-02-02 ENCOUNTER — Encounter (HOSPITAL_COMMUNITY): Admission: RE | Payer: Self-pay | Source: Home / Self Care

## 2021-02-02 SURGERY — IMPEDANCE PH STUDY, ESOPHAGUS
Anesthesia: Topical

## 2021-05-18 ENCOUNTER — Telehealth: Payer: Self-pay

## 2021-05-18 NOTE — Telephone Encounter (Signed)
Pt will be in [place] clinic tomorrow 11/8 at 830 for a sick visit

## 2021-05-18 NOTE — Telephone Encounter (Signed)
Patient called stating that she hasn't been feeling well for several days. She had a fever, chills, muscle weakness, sore throat and sinus congestion. She went to the urgent care on Saturday and was tested for the Flu which came back negative. She stated that since then everything has gone away except the sore throat, however patient is now experiencing neck and ear pressure and is wonder what she can do to help. Patient also informed me that she is [redacted] weeks pregnant. Please advise.

## 2021-05-18 NOTE — Telephone Encounter (Signed)
Lets have Elizabeth Booker come into clinic in [place] tomorrow at 830.

## 2021-05-19 ENCOUNTER — Ambulatory Visit (INDEPENDENT_AMBULATORY_CARE_PROVIDER_SITE_OTHER): Payer: BC Managed Care – PPO | Admitting: Allergy and Immunology

## 2021-05-19 ENCOUNTER — Encounter: Payer: Self-pay | Admitting: Allergy and Immunology

## 2021-05-19 ENCOUNTER — Other Ambulatory Visit: Payer: Self-pay

## 2021-05-19 VITALS — BP 116/72 | HR 71 | Temp 97.9°F | Resp 16 | Ht 63.0 in | Wt 128.0 lb

## 2021-05-19 DIAGNOSIS — J069 Acute upper respiratory infection, unspecified: Secondary | ICD-10-CM

## 2021-05-19 DIAGNOSIS — J3089 Other allergic rhinitis: Secondary | ICD-10-CM

## 2021-05-19 DIAGNOSIS — Z91018 Allergy to other foods: Secondary | ICD-10-CM

## 2021-05-19 DIAGNOSIS — J4550 Severe persistent asthma, uncomplicated: Secondary | ICD-10-CM | POA: Diagnosis not present

## 2021-05-19 DIAGNOSIS — K219 Gastro-esophageal reflux disease without esophagitis: Secondary | ICD-10-CM

## 2021-05-19 MED ORDER — PULMICORT FLEXHALER 180 MCG/ACT IN AEPB: 2.0000 | INHALATION_SPRAY | Freq: Two times a day (BID) | RESPIRATORY_TRACT | 5 refills | Status: DC

## 2021-05-19 MED ORDER — MONTELUKAST SODIUM 10 MG PO TABS: ORAL_TABLET | ORAL | 3 refills | Status: DC

## 2021-05-19 MED ORDER — LEVOCETIRIZINE DIHYDROCHLORIDE 5 MG PO TABS: 5.0000 mg | ORAL_TABLET | Freq: Every day | ORAL | 11 refills | Status: DC | PRN

## 2021-05-19 MED ORDER — BUDESONIDE 32 MCG/ACT NA SUSP: 2.0000 | Freq: Every day | NASAL | 5 refills | Status: DC

## 2021-05-19 NOTE — Progress Notes (Signed)
[place] - [place] - [place] - [place] - [place]   Follow-up Note  Referring Provider: No ref. provider found Primary Provider: Patient, No Pcp Per (Inactive) Date of Office Visit: 05/19/2021  Subjective:   Elizabeth Booker (DOB: 1995/12/07) is a 25 y.o. female who [facility] in re-evaluation of the following:  HPI: Konrad Felix returns to this clinic in reevaluation of asthma and allergic rhinitis and history of food allergy directed against fruits and Onions, and history of reflux.  I have not seen her in this clinic since 12 August 2020.  She is currently [redacted] weeks pregnant.  She has done very well since her last visit without the need for systemic steroid or antibiotic for any type of airway issue and overall she felt as though her asthma and her nasal issue and her reflux was under good control with a plan that included anti-inflammatory agents for both her upper and lower airway and treatment for reflux.  Unfortunately, 5 days ago she developed flulike symptoms and she went to the urgent care center and had a negative COVID test and negative influenza test.  No specific therapy was prescribed for that issue.  Currently, she is much better than she was several days ago.  She no longer has a low-grade fever and all of her muscle aches have resolved.  She does feel as though her neck is a little bit swollen.  Her throat hurts.  Both of these issues are also a little bit better over the course of the past 24 hours.  She had some "gunk" coming out of her left eye yesterday.  She has a very slight cough and very slight nasal stuffiness.  She did have a lot of mucus in her nose and throat but that has improved.  She has not lost her ability to smell or taste.  She does not really have any chest tightness or shortness of breath.  When she found out that she was pregnant she eliminated her proton pump inhibitor and surprisingly her reflux is  actually under pretty good control at this point.  She remains away from consumption of raw fruits and onions.  Allergies as of 05/19/2021       Reactions   Iohexol Swelling   Throat swelling oral , per Dr. Fredirick Lathe per Pre meds with IV contrast or do scan without //rls        Medication List    AirDuo Digihaler 113-14 MCG/ACT Aepb Generic drug: Fluticasone-Salmeterol(sensor) Inhale 2 puffs into the lungs in the morning and at bedtime.   Alvesco 160 MCG/ACT inhaler Generic drug: ciclesonide Inhale 1 puff into the lungs 2 (two) times daily.   Dexilant 60 MG capsule Generic drug: dexlansoprazole TAKE 1 CAPSULE BY MOUTH EVERY MORNING   Dulera 200-5 MCG/ACT Aero Generic drug: mometasone-formoterol Inhale 2 puffs into the lungs 2 (two) times daily.   EPINEPHrine 0.3 mg/0.3 mL Soaj injection Commonly known as: EPI-PEN INJECT CONTENTS OF 1 PEN AS NEEDED FOR LIFE-THREATENING ALLERGIC REACTION   fluticasone 50 MCG/ACT nasal spray Commonly known as: FLONASE Place 1-2 sprays into both nostrils as needed.   ibuprofen 200 MG tablet Commonly known as: ADVIL Take 400 mg by mouth every 6 (six) hours as needed. For pain   ipratropium-albuterol 0.5-2.5 (3) MG/3ML Soln Commonly known as: DUONEB USE 1 VIAL IN NEBULIZER EVERY 6 HOURS AS NEEDED   levocetirizine 5 MG tablet Commonly known as: XYZAL Take 1 tablet (5 mg  total) by mouth daily as needed.   montelukast 10 MG tablet Commonly known as: SINGULAIR TAKE 1 TABLET EVERY DAY AS DIRECTED   pantoprazole 40 MG tablet Commonly known as: PROTONIX Take 1 tablet (40 mg total) by mouth at bedtime.   PATANOL OP Apply to eye. Uses PRN   ProAir HFA 108 (90 Base) MCG/ACT inhaler Generic drug: albuterol INHALE 2 PUFFS BY MOUTH EVERY 4 HOURS AS NEEDED FOR WHEEZING OR SHORTNESS OF BREATH   albuterol 108 (90 Base) MCG/ACT inhaler Commonly known as: VENTOLIN HFA INHALE 2 PUFFS BY MOUTH EVERY 6 HOURS AS NEEDED FOR WHEEZINGFOR SHORTNESS  OF BREATH   Pulmicort Flexhaler 180 MCG/ACT inhaler Generic drug: budesonide Inhale 2 puffs into the lungs in the morning and at bedtime.   rizatriptan 10 MG disintegrating tablet Commonly known as: MAXALT-MLT Take 1 tablet (10 mg total) by mouth as needed for migraine. May repeat in 2 hours if needed    Past Medical History:  Diagnosis Date   Allergy    Asthma    GERD (gastroesophageal reflux disease)    Kidney infection    Migraines    Vasovagal near-syncope     Past Surgical History:  Procedure Laterality Date   ESOPHAGEAL MANOMETRY N/A 12/15/2016   Procedure: ESOPHAGEAL MANOMETRY (EM);  Surgeon: Iva Boop, MD;  Location: [facility];  Service: Endoscopy;  Laterality: N/A;   ESOPHAGOGASTRODUODENOSCOPY (EGD) WITH ESOPHAGEAL DILATION  08/30/2016   MOUTH SURGERY     [place] IMPEDANCE STUDY N/A 12/15/2016   Procedure: PH IMPEDANCE STUDY;  Surgeon: Iva Boop, MD;  Location: [facility];  Service: Endoscopy;  Laterality: N/A;   WISDOM TOOTH EXTRACTION      Review of systems negative except as noted in HPI / PMHx or noted below:  Review of Systems  Constitutional: Negative.   HENT: Negative.    Eyes: Negative.   Respiratory: Negative.    Cardiovascular: Negative.   Gastrointestinal: Negative.   Genitourinary: Negative.   Musculoskeletal: Negative.   Skin: Negative.   Neurological: Negative.   Endo/Heme/Allergies: Negative.   Psychiatric/Behavioral: Negative.      Objective:   Vitals:   05/19/21 0905  BP: 116/72  Pulse: 71  Resp: 16  Temp: 97.9 F (36.6 C)  SpO2: 98%   Height: 5\' 3"  (160 cm)  Weight: 128 lb (58.1 kg)   Physical Exam Constitutional:      Appearance: She is not diaphoretic.     Comments: Nasal voice  HENT:     Head: Normocephalic.     Right Ear: Tympanic membrane, ear canal and external ear normal.     Left Ear: Tympanic membrane, ear canal and external ear normal.     Nose: Mucosal edema (Erythematous) present. No rhinorrhea.      Mouth/Throat:     Pharynx: Uvula midline. Posterior oropharyngeal erythema present. No oropharyngeal exudate.  Eyes:     Conjunctiva/sclera:     Left eye: Left conjunctiva is injected.  Neck:     Thyroid: No thyromegaly.     Trachea: Trachea normal. No tracheal tenderness or tracheal deviation.  Cardiovascular:     Rate and Rhythm: Normal rate and regular rhythm.     Heart sounds: Normal heart sounds, S1 normal and S2 normal. No murmur heard. Pulmonary:     Effort: No respiratory distress.     Breath sounds: Normal breath sounds. No stridor. No wheezing or rales.  Lymphadenopathy:     Head:     Right side of head:  No tonsillar adenopathy.     Left side of head: No tonsillar adenopathy.     Cervical: No cervical adenopathy.  Skin:    Findings: No erythema or rash.     Nails: There is no clubbing.  Neurological:     Mental Status: She is alert.    Diagnostics:   The patient had an Asthma Control Test with the following results: ACT Total Score: 25.    Assessment and Plan:   1. Asthma, severe persistent, well-controlled   2. Other allergic rhinitis   3. Food allergy   4. Viral upper respiratory tract infection   5. Gastroesophageal reflux disease, unspecified whether esophagitis present      1. Continue inflammation with the following:   A. Pulmicort 180 - 2 inhalations 1-2 times per day Elwin Sleight)  B. OTC Rhinocort / Budesonide 1-2 sprays each nostril 1-7 times a week (Flonase)  C. montelukast 10 mg 1 time per day   2. Evaluate and treat reflux with the following:   A. OTC Tums  3. Continue levo cetirizine, Epi-Pen, and albuterol MDI/nebulizer if needed  4. "Action Plan" for flare-up:   A. Increase Pulmicort to 3 inhalations 3 times per day  B. Use albuterol if needed  5. For this current issue:   A. Tylenol  B. Mucinex  C. Nasal saline   D. Systane  6.  Return to clinic in 12 months or earlier if problem  7. Consider flu vaccine after 1st  trimester  Kloie appears to have a viral respiratory tract infection and fortunately this appears to be improving over the course of the past 24 to 48 hours.  I am not going to give her any specific therapy for this infection other than having her use symptomatic medications as noted above.  Because she is pregnant we will switch her over to class B pregnancy rating medications to replace her Dulera and Flonase.  She has already eliminated the use of her proton pump inhibitor and she can use over-the-counter Tums regarding this issue.  Assuming she does well with this plan we will see her back in this clinic in a year or earlier if there is a problem.  I did make the recommendation that she obtain a flu vaccine after her first trimester.  She is not very excited about getting immunized at this point in time.   Laurette Schimke, MD Allergy / Immunology [facility]

## 2021-05-19 NOTE — Patient Instructions (Addendum)
  1. Continue inflammation with the following:   A. Pulmicort 180 - 2 inhalations 1-2 times per day Elwin Sleight)  B. OTC Rhinocort / Budesonide 1-2 sprays each nostril 1-7 times a week (Flonase)  C. montelukast 10 mg 1 time per day   2. Evaluate and treat reflux with the following:   A. OTC Tums  3. Continue levo cetirizine, Epi-Pen, and albuterol MDI/nebulizer if needed  4. "Action Plan" for flare-up:   A. Increase Pulmicort to 3 inhalations 3 times per day  B. Use albuterol if needed  5. For this current issue:   A. Tylenol  B. Mucinex  C. Nasal saline   D. Systane  6.  Return to clinic in 12 months or earlier if problem  7. Consider flu vaccine after 1st trimester

## 2021-05-20 ENCOUNTER — Telehealth: Payer: Self-pay | Admitting: *Deleted

## 2021-05-20 ENCOUNTER — Encounter: Payer: Self-pay | Admitting: Allergy and Immunology

## 2021-05-20 NOTE — Telephone Encounter (Signed)
PA has been submitted for Budesonide inhaler through CoverMyMeds and is currently pending approval/denial. Insurance ID [id number], BIN [id number].

## 2021-05-21 NOTE — Telephone Encounter (Signed)
PA has been approved for Pulmicort inhaler. PA has been sent electronically to the pharmacy and is good for 1 year.

## 2021-05-29 ENCOUNTER — Telehealth: Payer: Self-pay | Admitting: *Deleted

## 2021-05-29 NOTE — Telephone Encounter (Signed)
Patient called stating that she is not feeling better after seeing Dr. Lucie Leather on November 10th. She states that she is still having some sinus pressure and drainage and she still has some "spots" towards the back of her throat and her throat was still sore. I reviewed Dr. Kathyrn Lass AVS with her and she stated that she was not able to pick up the Pulmicort inhaler due to coverage, I advised that I did a PA and got it approved. She is going to call the pharmacy about this. She was not taking the Budesonide nasal spray I did instruct to purchase some over the counter and start using that. Patient verbalized understanding and will call back if she needs anything further. Is there anything extra you recommend? According to the patient she had the flu and then a sinus infection, she is currently [redacted] weeks pregnant.

## 2021-06-09 LAB — OB RESULTS CONSOLE ABO/RH: RH Type: NEGATIVE

## 2021-06-09 LAB — OB RESULTS CONSOLE RPR: RPR: NONREACTIVE

## 2021-06-09 LAB — OB RESULTS CONSOLE HEPATITIS B SURFACE ANTIGEN: Hepatitis B Surface Ag: NEGATIVE

## 2021-06-09 LAB — OB RESULTS CONSOLE HIV ANTIBODY (ROUTINE TESTING): HIV: NONREACTIVE

## 2021-06-09 LAB — OB RESULTS CONSOLE ANTIBODY SCREEN: Antibody Screen: NEGATIVE

## 2021-06-09 LAB — HEPATITIS C ANTIBODY: HCV Ab: NEGATIVE

## 2021-06-25 LAB — OB RESULTS CONSOLE GC/CHLAMYDIA
Chlamydia: NEGATIVE
Neisseria Gonorrhea: NEGATIVE

## 2021-07-12 NOTE — L&D Delivery Note (Signed)
Delivery Note  SVD viable female Apgars 9,9 over 1st degree.  Placenta delivered spontaneously intact with 3VC. Repair with 2-0 Chromic with good support and hemostasis noted.  R/V exam confirms..   Mother and baby to couplet care and are doing well.  EBL 150cc  Candice Camp, MD

## 2021-07-14 ENCOUNTER — Telehealth: Payer: Self-pay

## 2021-07-14 MED ORDER — LEVOCETIRIZINE DIHYDROCHLORIDE 5 MG PO TABS: 5.0000 mg | ORAL_TABLET | Freq: Every day | ORAL | 0 refills | Status: DC | PRN

## 2021-07-14 MED ORDER — MONTELUKAST SODIUM 10 MG PO TABS: ORAL_TABLET | ORAL | 0 refills | Status: DC

## 2021-07-14 NOTE — Telephone Encounter (Signed)
Patient called requesting her [facility] Refills have been sent to the requested pharmacy. Patient has been made ware.

## 2021-08-28 ENCOUNTER — Other Ambulatory Visit: Payer: Self-pay

## 2021-08-28 ENCOUNTER — Inpatient Hospital Stay (HOSPITAL_COMMUNITY)
Admission: AD | Admit: 2021-08-28 | Discharge: 2021-08-28 | Disposition: A | Discharge status: Home / Self Care | Payer: BC Managed Care – PPO | Attending: Obstetrics and Gynecology | Admitting: Obstetrics and Gynecology

## 2021-08-28 ENCOUNTER — Encounter (HOSPITAL_COMMUNITY): Payer: Self-pay | Admitting: Obstetrics and Gynecology

## 2021-08-28 DIAGNOSIS — O99612 Diseases of the digestive system complicating pregnancy, second trimester: Secondary | ICD-10-CM | POA: Insufficient documentation

## 2021-08-28 DIAGNOSIS — R141 Gas pain: Secondary | ICD-10-CM | POA: Diagnosis not present

## 2021-08-28 DIAGNOSIS — Z3A20 20 weeks gestation of pregnancy: Secondary | ICD-10-CM | POA: Diagnosis not present

## 2021-08-28 DIAGNOSIS — Z3492 Encounter for supervision of normal pregnancy, unspecified, second trimester: Secondary | ICD-10-CM

## 2021-08-28 LAB — POCT PREGNANCY, URINE: Preg Test, Ur: POSITIVE — AB

## 2021-08-28 LAB — URINALYSIS, ROUTINE W REFLEX MICROSCOPIC
Bilirubin Urine: NEGATIVE
Glucose, UA: NEGATIVE mg/dL
Hgb urine dipstick: NEGATIVE
Ketones, ur: NEGATIVE mg/dL
Leukocytes,Ua: NEGATIVE
Nitrite: NEGATIVE
Protein, ur: NEGATIVE mg/dL
Specific Gravity, Urine: 1.014 (ref 1.005–1.030)
pH: 9 — ABNORMAL HIGH (ref 5.0–8.0)

## 2021-08-28 NOTE — MAU Provider Note (Signed)
Event Date/Time   First Provider Initiated Contact with Patient 08/28/21 1659     S Ms. Elizabeth Booker is a 26 y.o. G1P0 pregnant female at [redacted]w[redacted]d  who presents to MAU today with complaint of pain at her belly button while walking around this afternoon that was intense but has since subsided. Denies fever, nausea, vomiting, diarrhea or constipation. Last BM was last night and normal for her. Ate [employer] for lunch. No other physical complaints.  Receives care at [facility] for [place]. Denies history of fibroids or other pregnancy complaints.  Pertinent items noted in HPI and remainder of comprehensive ROS otherwise negative.   O BP (!) 104/55 (BP Location: Right Arm)    Pulse 72    Temp 97.8 F (36.6 C)    Resp 15    LMP 12/03/2020  Physical Exam Vitals and nursing note reviewed.  Constitutional:      General: She is not in acute distress.    Appearance: She is well-developed and normal weight. She is not ill-appearing.  HENT:     Head: Normocephalic and atraumatic.  Eyes:     Pupils: Pupils are equal, round, and reactive to light.  Cardiovascular:     Rate and Rhythm: Normal rate and regular rhythm.  Pulmonary:     Effort: Pulmonary effort is normal.  Abdominal:     General: Bowel sounds are normal. There is no distension.     Palpations: Abdomen is soft.     Tenderness: There is abdominal tenderness (very mild when pressed, no rebound) in the periumbilical area. There is no guarding or rebound. Negative signs include McBurney's sign and psoas sign.     Hernia: No hernia is present.     Comments: Appropriately gravid  Genitourinary:    Comments: Vaginal exam deferred Skin:    General: Skin is warm and dry.     Capillary Refill: Capillary refill takes less than 2 seconds.  Neurological:     Mental Status: She is alert and oriented to person, place, and time.  Psychiatric:        Mood and Affect: Mood normal.        Behavior: Behavior normal.   Reassured  patient that her pain was likely a gas bubble and reviewed reasons to return to MAU if it occurs again. Pt expressed relief and understanding.  A Gas pain Fetal heart tones present, second trimester [redacted] weeks gestation of pregnancy  P Discharge from MAU in stable condition with return precautions Follow up at [facility] as scheduled for ongoing prenatal care.  Gabriel Carina, [place] 08/28/2021 5:28 PM

## 2021-08-28 NOTE — MAU Note (Signed)
Pt reports she stared having pain around her belly button earlier today . Pain gets worse when she walks or movement and is painful to touch. Denies and vag discharge or bleeding .

## 2021-09-01 ENCOUNTER — Ambulatory Visit: Payer: 59 | Admitting: Allergy and Immunology

## 2021-10-21 ENCOUNTER — Other Ambulatory Visit: Payer: Self-pay | Admitting: Allergy and Immunology

## 2021-10-27 ENCOUNTER — Encounter: Payer: Self-pay | Admitting: Allergy and Immunology

## 2021-10-27 ENCOUNTER — Ambulatory Visit (INDEPENDENT_AMBULATORY_CARE_PROVIDER_SITE_OTHER): Payer: BC Managed Care – PPO | Admitting: Allergy and Immunology

## 2021-10-27 VITALS — BP 128/70 | HR 91 | Temp 98.3°F | Resp 16 | Ht 63.0 in | Wt 155.4 lb

## 2021-10-27 DIAGNOSIS — J3089 Other allergic rhinitis: Secondary | ICD-10-CM | POA: Diagnosis not present

## 2021-10-27 DIAGNOSIS — J4550 Severe persistent asthma, uncomplicated: Secondary | ICD-10-CM | POA: Diagnosis not present

## 2021-10-27 DIAGNOSIS — R040 Epistaxis: Secondary | ICD-10-CM

## 2021-10-27 DIAGNOSIS — K219 Gastro-esophageal reflux disease without esophagitis: Secondary | ICD-10-CM | POA: Diagnosis not present

## 2021-10-27 DIAGNOSIS — T781XXD Other adverse food reactions, not elsewhere classified, subsequent encounter: Secondary | ICD-10-CM

## 2021-10-27 NOTE — Patient Instructions (Addendum)
?  1. Continue inflammation with the following: ? ? A. montelukast 10 mg 1 time per day  ? ?2. Evaluate and treat reflux with the following: ? ? A. OTC Tums (calcium/magnesium based antacids) ? ?3. If needed: ? ?A. Levo cetirizine ?B. Epi-Pen ?C. Albuterol MDI/nebulizer ? ?4. Can use the following if airway issues develop: ? ?A. Pulmicort 180 - 2 inhalations 1-2 times per day Elwin Sleight) ? B. OTC Rhinocort / Budesonide 1-2 sprays each nostril 1-7 times a week  ? ?5.  Can visit with ENT if recurrent epistaxis  ? ?6.  Return to clinic in 6 months or earlier if problem ?  ? ?  ?  ? ?

## 2021-10-27 NOTE — Progress Notes (Signed)
? ?[place] ? ? ?Follow-up Note ? ?Referring Provider: No ref. provider found ?Primary Provider: Patient, No Pcp Per (Inactive)  ?Date of Office Visit: 10/27/2021 ? ?Subjective:  ? ?Elizabeth Booker (DOB: [birth year]) is a 26 y.o. female who returns to the [facility] [place] on 10/27/2021 in re-evaluation of the following: ? ?HPI: Elizabeth Booker returns to this clinic in evaluation of asthma, allergic rhinitis, food allergy directed against fruits and onion, and history of reflux.  I have not seen her in this clinic since 19 May 2021. ? ?She is currently pregnant with a delivery date of 11 January 2022. ? ?She has done very well with her lower airway and does not use any Pulmicort on a consistent basis and has no need to use any short acting bronchodilator.  She has not required a systemic steroid to treat an exacerbation. ? ?Her nose is doing relatively well without the use of any nasal steroids.  She does continue to use montelukast on a consistent basis.  She has not required any antibiotic to treat an episode of sinusitis.  She does have a history of recurrent epistaxis from both nostrils that appeared to occur several times per month.  She has also had a little bit of limitation of airflow on the right side compared to the left but still has some airflow through both sides. ? ?Her reflux is terrible especially during pregnancy.  She relies on the use of Tums but she only uses Tums to chase her symptoms rather than trying to prevent her symptoms when she eats. ? ?She still has some oral symptoms if she eats watermelon or cucumber or onion.  She finds that this issue is actually a little bit better at this point in time.  She has not had to use an EpiPen. ? ?Allergies as of 10/27/2021   ? ?   Reactions  ? Iohexol Swelling  ? Throat swelling oral , per Dr. Rosario Jacks per Pre meds with IV contrast or do scan without //rls  ? ?  ? ?  ?Medication List  ? ? ?AirDuo Digihaler  113-14 MCG/ACT Aepb ?Generic drug: Fluticasone-Salmeterol(sensor) ?Inhale 2 puffs into the lungs in the morning and at bedtime. ?  ?albuterol 108 (90 Base) MCG/ACT inhaler ?Commonly known as: VENTOLIN HFA ?INHALE 2 PUFFS BY MOUTH EVERY 6 HOURS AS NEEDED FOR WHEEZINGFOR SHORTNESS OF BREATH ?  ?Alvesco 160 MCG/ACT inhaler ?Generic drug: ciclesonide ?Inhale 1 puff into the lungs 2 (two) times daily. ?  ?budesonide 32 MCG/ACT nasal spray ?Commonly known as: RHINOCORT AQUA ?Place 2 sprays into both nostrils daily. ?  ?EPINEPHrine 0.3 mg/0.3 mL Soaj injection ?Commonly known as: EPI-PEN ?INJECT CONTENTS OF 1 PEN AS NEEDED FOR LIFE-THREATENING ALLERGIC REACTION ?  ?ipratropium-albuterol 0.5-2.5 (3) MG/3ML Soln ?Commonly known as: DUONEB ?USE 1 VIAL IN NEBULIZER EVERY 6 HOURS AS NEEDED ?  ?levocetirizine 5 MG tablet ?Commonly known as: XYZAL ?Take 1 tablet (5 mg total) by mouth daily as needed. ?  ?montelukast 10 MG tablet ?Commonly known as: SINGULAIR ?TAKE ONE TABLET BY MOUTH ONCE DAILY AS DIRECTED ?  ?PATANOL OP ?Apply to eye. Uses PRN ?  ?Pulmicort Flexhaler 180 MCG/ACT inhaler ?Generic drug: budesonide ?Inhale 2 puffs into the lungs in the morning and at bedtime. ?  ? ?Past Medical History:  ?Diagnosis Date  ? Allergy   ? Asthma   ? GERD (gastroesophageal reflux disease)   ? Kidney infection   ? Migraines   ?  Vasovagal near-syncope   ? ? ?Past Surgical History:  ?Procedure Laterality Date  ? ESOPHAGEAL MANOMETRY N/A 12/15/2016  ? Procedure: ESOPHAGEAL MANOMETRY (EM);  Surgeon: Gatha Mayer, MD;  Location: [facility];  Service: Endoscopy;  Laterality: N/A;  ? ESOPHAGOGASTRODUODENOSCOPY (EGD) WITH ESOPHAGEAL DILATION  08/30/2016  ? MOUTH SURGERY    ? [place] IMPEDANCE STUDY N/A 12/15/2016  ? Procedure: [place] IMPEDANCE STUDY;  Surgeon: Gatha Mayer, MD;  Location: [facility];  Service: Endoscopy;  Laterality: N/A;  ? WISDOM TOOTH EXTRACTION    ? ? ?Review of systems negative except as noted in HPI / PMHx or noted  below: ? ?Review of Systems  ?Constitutional: Negative.   ?HENT: Negative.    ?Eyes: Negative.   ?Respiratory: Negative.    ?Cardiovascular: Negative.   ?Gastrointestinal: Negative.   ?Genitourinary: Negative.   ?Musculoskeletal: Negative.   ?Skin: Negative.   ?Neurological: Negative.   ?Endo/Heme/Allergies: Negative.   ?Psychiatric/Behavioral: Negative.    ? ? ?Objective:  ? ?Vitals:  ? 10/27/21 1629  ?BP: 128/70  ?Pulse: 91  ?Resp: 16  ?Temp: 98.3 ?F (36.8 ?C)  ?SpO2: 96%  ? ?Height: 5\' 3"  (160 cm)  ?Weight: 155 lb 6.4 oz (70.5 kg)  ? ?Physical Exam ?Constitutional:   ?   Appearance: She is not diaphoretic.  ?HENT:  ?   Head: Normocephalic.  ?   Right Ear: Tympanic membrane, ear canal and external ear normal.  ?   Left Ear: Tympanic membrane, ear canal and external ear normal.  ?   Nose: Nose normal. No mucosal edema or rhinorrhea.  ?   Mouth/Throat:  ?   Pharynx: Uvula midline. No oropharyngeal exudate.  ?Eyes:  ?   Conjunctiva/sclera: Conjunctivae normal.  ?Neck:  ?   Thyroid: No thyromegaly.  ?   Trachea: Trachea normal. No tracheal tenderness or tracheal deviation.  ?Cardiovascular:  ?   Rate and Rhythm: Normal rate and regular rhythm.  ?   Heart sounds: Normal heart sounds, S1 normal and S2 normal. No murmur heard. ?Pulmonary:  ?   Effort: No respiratory distress.  ?   Breath sounds: Normal breath sounds. No stridor. No wheezing or rales.  ?Lymphadenopathy:  ?   Head:  ?   Right side of head: No tonsillar adenopathy.  ?   Left side of head: No tonsillar adenopathy.  ?   Cervical: No cervical adenopathy.  ?Skin: ?   Findings: No erythema or rash.  ?   Nails: There is no clubbing.  ?Neurological:  ?   Mental Status: She is alert.  ? ? ?Diagnostics:  ?  ?Spirometry was performed and demonstrated an FEV1 of 2.66 at 84 % of predicted. ? ?Assessment and Plan:  ? ?1. Asthma, severe persistent, well-controlled   ?2. Other allergic rhinitis   ?3. Pollen-food allergy, subsequent encounter   ?4. Gastroesophageal reflux  disease, unspecified whether esophagitis present   ?5. Epistaxis   ?  ?1. Continue inflammation with the following: ? ? A. montelukast 10 mg 1 time per day  ? ?2. Evaluate and treat reflux with the following: ? ? A. OTC Tums (calcium/magnesium based antacids) ? ?3. If needed: ? ?A. Levo cetirizine ?B. Epi-Pen ?C. Albuterol MDI/nebulizer ? ?4. Can use the following if airway issues develop: ? ?A. Pulmicort 180 - 2 inhalations 1-2 times per day Ruthe Mannan) ? B. OTC Rhinocort / Budesonide 1-2 sprays each nostril 1-7 times a week  ? ?5.  Can visit with ENT if recurrent epistaxis  ? ?  6.  Return to clinic in 6 months or earlier if problem ?  ?Latrinda appears to be doing very well and has a good understanding about her disease state and appropriate use of her medications depending on disease activity.  For now she will remain on montelukast as her only controller agent for her atopic airway issue.  She has a selection of agents to utilize should they be required.  She can use over-the-counter calcium/magnesium based antacids to treat her reflux while she is pregnant.  If she has problems with recurrent epistaxis she can visit with ENT to have cautery.  We will see her back in this clinic in 6 months or earlier if there is a problem. ? ?Allena Katz, MD ?Allergy / Immunology ?[place] ?

## 2021-10-28 ENCOUNTER — Encounter: Payer: Self-pay | Admitting: Allergy and Immunology

## 2021-12-14 LAB — OB RESULTS CONSOLE GBS: GBS: POSITIVE

## 2021-12-31 ENCOUNTER — Encounter (HOSPITAL_COMMUNITY): Payer: Self-pay

## 2021-12-31 ENCOUNTER — Encounter (HOSPITAL_COMMUNITY): Payer: Self-pay | Admitting: *Deleted

## 2021-12-31 ENCOUNTER — Telehealth (HOSPITAL_COMMUNITY): Payer: Self-pay | Admitting: *Deleted

## 2021-12-31 NOTE — Telephone Encounter (Signed)
Preadmission screen  

## 2022-01-04 ENCOUNTER — Encounter (HOSPITAL_COMMUNITY): Payer: Self-pay | Admitting: Obstetrics and Gynecology

## 2022-01-04 ENCOUNTER — Inpatient Hospital Stay (EMERGENCY_DEPARTMENT_HOSPITAL)
Admission: AD | Admit: 2022-01-04 | Discharge: 2022-01-04 | Disposition: A | Discharge status: Home / Self Care | Payer: BC Managed Care – PPO | Source: Home / Self Care | Attending: Obstetrics and Gynecology | Admitting: Obstetrics and Gynecology

## 2022-01-04 DIAGNOSIS — Z0371 Encounter for suspected problem with amniotic cavity and membrane ruled out: Secondary | ICD-10-CM

## 2022-01-04 DIAGNOSIS — Z3A39 39 weeks gestation of pregnancy: Secondary | ICD-10-CM | POA: Insufficient documentation

## 2022-01-04 DIAGNOSIS — O471 False labor at or after 37 completed weeks of gestation: Secondary | ICD-10-CM | POA: Insufficient documentation

## 2022-01-04 LAB — POCT FERN TEST

## 2022-01-04 NOTE — MAU Provider Note (Signed)
S: Ms. Elizabeth Booker is a 26 y.o. G1P0 at [redacted]w[redacted]d  who presents to MAU today complaining of leaking of fluid since 1600. She denies vaginal bleeding. She denies contractions. She reports normal fetal movement.    O: BP 121/80   Pulse (!) 101   Temp 97.8 F (36.6 C)   Resp 20   Ht 5\' 3"  (1.6 m)   Wt 75.2 kg   LMP 12/03/2020   BMI 29.35 kg/m  GENERAL: Well-developed, well-nourished female in no acute distress.  HEAD: Normocephalic, atraumatic.  CHEST: Normal effort of breathing, regular heart rate ABDOMEN: Soft, nontender, gravid PELVIC: Normal external female genitalia. Vagina is pink and rugated. Cervix with normal contour, no lesions. Normal discharge. Negative pooling.   Cervical exam:  Deferred   Fetal Monitoring: Baseline: 125  bpm Variability:  Moderate Accelerations: 15x15 Decelerations:  None Contractions: UI  Results for orders placed or performed during the hospital encounter of 01/04/22 (from the past 24 hour(s))  Fern Test     Status: Normal   Collection Time: 01/04/22  8:18 PM  Result Value Ref Range   POCT Fern Test       A: SIUP at [redacted]w[redacted]d  Membranes intact Fern negative, no pooling.   P:  DC home Keep OB appointment tomorrow.   [redacted]w[redacted]d, NP 01/04/2022 8:27 PM

## 2022-01-07 ENCOUNTER — Inpatient Hospital Stay (HOSPITAL_COMMUNITY): Payer: BC Managed Care – PPO | Admitting: Anesthesiology

## 2022-01-07 ENCOUNTER — Encounter (HOSPITAL_COMMUNITY): Payer: Self-pay | Admitting: Obstetrics and Gynecology

## 2022-01-07 ENCOUNTER — Other Ambulatory Visit: Payer: Self-pay

## 2022-01-07 ENCOUNTER — Inpatient Hospital Stay (HOSPITAL_COMMUNITY)
Admission: AD | Admit: 2022-01-07 | Discharge: 2022-01-09 | DRG: 807 | Disposition: A | Discharge status: Home / Self Care | Payer: BC Managed Care – PPO | Attending: Obstetrics and Gynecology | Admitting: Obstetrics and Gynecology

## 2022-01-07 DIAGNOSIS — Z6791 Unspecified blood type, Rh negative: Secondary | ICD-10-CM

## 2022-01-07 DIAGNOSIS — Z3A39 39 weeks gestation of pregnancy: Secondary | ICD-10-CM

## 2022-01-07 DIAGNOSIS — O99824 Streptococcus B carrier state complicating childbirth: Secondary | ICD-10-CM | POA: Diagnosis present

## 2022-01-07 DIAGNOSIS — Z3490 Encounter for supervision of normal pregnancy, unspecified, unspecified trimester: Secondary | ICD-10-CM

## 2022-01-07 DIAGNOSIS — O26893 Other specified pregnancy related conditions, third trimester: Secondary | ICD-10-CM | POA: Diagnosis present

## 2022-01-07 LAB — CBC
HCT: 34.9 % — ABNORMAL LOW (ref 36.0–46.0)
Hemoglobin: 12.3 g/dL (ref 12.0–15.0)
MCH: 31.6 pg (ref 26.0–34.0)
MCHC: 35.2 g/dL (ref 30.0–36.0)
MCV: 89.7 fL (ref 80.0–100.0)
Platelets: 219 10*3/uL (ref 150–400)
RBC: 3.89 MIL/uL (ref 3.87–5.11)
RDW: 13.6 % (ref 11.5–15.5)
WBC: 15.7 10*3/uL — ABNORMAL HIGH (ref 4.0–10.5)
nRBC: 0 % (ref 0.0–0.2)

## 2022-01-07 LAB — TYPE AND SCREEN
ABO/RH(D): O NEG
Antibody Screen: POSITIVE

## 2022-01-07 LAB — RPR: RPR Ser Ql: NONREACTIVE

## 2022-01-07 MED ORDER — OXYCODONE-ACETAMINOPHEN 5-325 MG PO TABS: 2.0000 | ORAL_TABLET | ORAL | Status: DC | PRN

## 2022-01-07 MED ORDER — MEDROXYPROGESTERONE ACETATE 150 MG/ML IM SUSP: 150.0000 mg | INTRAMUSCULAR | Status: DC | PRN

## 2022-01-07 MED ORDER — SIMETHICONE 80 MG PO CHEW: 80.0000 mg | CHEWABLE_TABLET | ORAL | Status: DC | PRN

## 2022-01-07 MED ORDER — SODIUM CHLORIDE 0.9 % IV SOLN
5.0000 10*6.[IU] | Freq: Once | INTRAVENOUS | Status: AC
  Administered 2022-01-07: 5 10*6.[IU] via INTRAVENOUS
  Filled 2022-01-07: qty 5

## 2022-01-07 MED ORDER — OXYCODONE-ACETAMINOPHEN 5-325 MG PO TABS
1.0000 | ORAL_TABLET | ORAL | Status: DC | PRN
  Administered 2022-01-07 – 2022-01-08 (×4): 1 via ORAL
  Filled 2022-01-07 (×5): qty 1

## 2022-01-07 MED ORDER — COCONUT OIL OIL: 1.0000 | TOPICAL_OIL | Status: DC | PRN

## 2022-01-07 MED ORDER — OXYCODONE-ACETAMINOPHEN 5-325 MG PO TABS: 1.0000 | ORAL_TABLET | ORAL | Status: DC | PRN

## 2022-01-07 MED ORDER — ONDANSETRON HCL 4 MG/2ML IJ SOLN
4.0000 mg | Freq: Four times a day (QID) | INTRAMUSCULAR | Status: DC | PRN
Start: 2022-01-07 — End: 2022-01-07

## 2022-01-07 MED ORDER — ZOLPIDEM TARTRATE 5 MG PO TABS
5.0000 mg | ORAL_TABLET | Freq: Every evening | ORAL | Status: DC | PRN
  Administered 2022-01-08: 5 mg via ORAL
  Filled 2022-01-07: qty 1

## 2022-01-07 MED ORDER — EPHEDRINE 5 MG/ML INJ: 10.0000 mg | INTRAVENOUS | Status: DC | PRN

## 2022-01-07 MED ORDER — BUPIVACAINE HCL (PF) 0.25 % IJ SOLN
INTRAMUSCULAR | Status: DC | PRN
  Administered 2022-01-07 (×2): 3 mL via EPIDURAL

## 2022-01-07 MED ORDER — MEASLES, MUMPS & RUBELLA VAC IJ SOLR: 0.5000 mL | Freq: Once | INTRAMUSCULAR | Status: DC

## 2022-01-07 MED ORDER — PENICILLIN G POT IN DEXTROSE 60000 UNIT/ML IV SOLN
3.0000 10*6.[IU] | INTRAVENOUS | Status: DC
  Administered 2022-01-07 (×3): 3 10*6.[IU] via INTRAVENOUS
  Filled 2022-01-07 (×3): qty 50

## 2022-01-07 MED ORDER — ACETAMINOPHEN 325 MG PO TABS
650.0000 mg | ORAL_TABLET | ORAL | Status: DC | PRN
  Administered 2022-01-07 – 2022-01-09 (×4): 650 mg via ORAL
  Filled 2022-01-07 (×4): qty 2

## 2022-01-07 MED ORDER — FENTANYL-BUPIVACAINE-NACL 0.5-0.125-0.9 MG/250ML-% EP SOLN
12.0000 mL/h | EPIDURAL | Status: DC | PRN
  Administered 2022-01-07: 12 mL/h via EPIDURAL
  Filled 2022-01-07: qty 250

## 2022-01-07 MED ORDER — ONDANSETRON HCL 4 MG/2ML IJ SOLN: 4.0000 mg | INTRAMUSCULAR | Status: DC | PRN

## 2022-01-07 MED ORDER — LACTATED RINGERS IV SOLN
500.0000 mL | Freq: Once | INTRAVENOUS | Status: AC
  Administered 2022-01-07: 500 mL via INTRAVENOUS

## 2022-01-07 MED ORDER — IBUPROFEN 600 MG PO TABS
600.0000 mg | ORAL_TABLET | Freq: Four times a day (QID) | ORAL | Status: DC
  Administered 2022-01-07 – 2022-01-09 (×8): 600 mg via ORAL
  Filled 2022-01-07 (×8): qty 1

## 2022-01-07 MED ORDER — DIBUCAINE (PERIANAL) 1 % EX OINT: 1.0000 | TOPICAL_OINTMENT | CUTANEOUS | Status: DC | PRN

## 2022-01-07 MED ORDER — ACETAMINOPHEN 325 MG PO TABS
650.0000 mg | ORAL_TABLET | ORAL | Status: DC | PRN
Start: 2022-01-07 — End: 2022-01-07

## 2022-01-07 MED ORDER — ONDANSETRON HCL 4 MG PO TABS: 4.0000 mg | ORAL_TABLET | ORAL | Status: DC | PRN

## 2022-01-07 MED ORDER — PRENATAL MULTIVITAMIN CH
1.0000 | ORAL_TABLET | Freq: Every day | ORAL | Status: DC
  Administered 2022-01-08 – 2022-01-09 (×2): 1 via ORAL
  Filled 2022-01-07 (×2): qty 1

## 2022-01-07 MED ORDER — LACTATED RINGERS IV SOLN: INTRAVENOUS | Status: DC

## 2022-01-07 MED ORDER — FENTANYL CITRATE (PF) 100 MCG/2ML IJ SOLN: 50.0000 ug | INTRAMUSCULAR | Status: DC | PRN

## 2022-01-07 MED ORDER — ALBUTEROL SULFATE (2.5 MG/3ML) 0.083% IN NEBU: 3.0000 mL | INHALATION_SOLUTION | Freq: Four times a day (QID) | RESPIRATORY_TRACT | Status: DC | PRN

## 2022-01-07 MED ORDER — FLEET ENEMA 7-19 GM/118ML RE ENEM: 1.0000 | ENEMA | RECTAL | Status: DC | PRN

## 2022-01-07 MED ORDER — PHENYLEPHRINE 80 MCG/ML (10ML) SYRINGE FOR IV PUSH (FOR BLOOD PRESSURE SUPPORT): 80.0000 ug | PREFILLED_SYRINGE | INTRAVENOUS | Status: DC | PRN

## 2022-01-07 MED ORDER — TETANUS-DIPHTH-ACELL PERTUSSIS 5-2.5-18.5 LF-MCG/0.5 IM SUSY: 0.5000 mL | PREFILLED_SYRINGE | Freq: Once | INTRAMUSCULAR | Status: DC

## 2022-01-07 MED ORDER — DIPHENHYDRAMINE HCL 25 MG PO CAPS: 25.0000 mg | ORAL_CAPSULE | Freq: Four times a day (QID) | ORAL | Status: DC | PRN

## 2022-01-07 MED ORDER — OXYTOCIN-SODIUM CHLORIDE 30-0.9 UT/500ML-% IV SOLN
2.5000 [IU]/h | INTRAVENOUS | Status: DC
  Filled 2022-01-07: qty 500

## 2022-01-07 MED ORDER — LACTATED RINGERS IV SOLN: 500.0000 mL | INTRAVENOUS | Status: DC | PRN

## 2022-01-07 MED ORDER — BUDESONIDE 0.25 MG/2ML IN SUSP
2.0000 mL | Freq: Two times a day (BID) | RESPIRATORY_TRACT | Status: DC
  Filled 2022-01-07 (×3): qty 2

## 2022-01-07 MED ORDER — BENZOCAINE-MENTHOL 20-0.5 % EX AERO
1.0000 | INHALATION_SPRAY | CUTANEOUS | Status: DC | PRN
Start: 2022-01-07 — End: 2022-01-09
  Filled 2022-01-07: qty 56

## 2022-01-07 MED ORDER — LIDOCAINE-EPINEPHRINE (PF) 2 %-1:200000 IJ SOLN
INTRAMUSCULAR | Status: DC | PRN
  Administered 2022-01-07: 3 mL via EPIDURAL

## 2022-01-07 MED ORDER — LIDOCAINE HCL (PF) 1 % IJ SOLN: 30.0000 mL | INTRAMUSCULAR | Status: DC | PRN

## 2022-01-07 MED ORDER — SENNOSIDES-DOCUSATE SODIUM 8.6-50 MG PO TABS
2.0000 | ORAL_TABLET | Freq: Every day | ORAL | Status: DC
  Administered 2022-01-08 – 2022-01-09 (×2): 2 via ORAL
  Filled 2022-01-07 (×2): qty 2

## 2022-01-07 MED ORDER — WITCH HAZEL-GLYCERIN EX PADS: 1.0000 | MEDICATED_PAD | CUTANEOUS | Status: DC | PRN

## 2022-01-07 MED ORDER — SOD CITRATE-CITRIC ACID 500-334 MG/5ML PO SOLN
30.0000 mL | ORAL | Status: DC | PRN
  Administered 2022-01-07: 30 mL via ORAL
  Filled 2022-01-07: qty 30

## 2022-01-07 MED ORDER — OXYTOCIN BOLUS FROM INFUSION
333.0000 mL | Freq: Once | INTRAVENOUS | Status: AC
  Administered 2022-01-07: 333 mL via INTRAVENOUS

## 2022-01-07 MED ORDER — DIPHENHYDRAMINE HCL 50 MG/ML IJ SOLN: 12.5000 mg | INTRAMUSCULAR | Status: DC | PRN

## 2022-01-07 NOTE — MAU Note (Signed)
External monitors removed for transfer to LD via WC

## 2022-01-07 NOTE — Anesthesia Preprocedure Evaluation (Signed)
Anesthesia Evaluation  Patient identified by MRN, date of birth, ID band Patient awake    Reviewed: Allergy & Precautions, Patient's Chart, lab work & pertinent test results  History of Anesthesia Complications Negative for: history of anesthetic complications  Airway Mallampati: I  TM Distance: >3 FB Neck ROM: Full    Dental  (+) Teeth Intact, Dental Advisory Given   Pulmonary neg shortness of breath, asthma , neg recent URI,    breath sounds clear to auscultation       Cardiovascular negative cardio ROS   Rhythm:Regular     Neuro/Psych  Headaches, negative psych ROS   GI/Hepatic negative GI ROS, Neg liver ROS,   Endo/Other  negative endocrine ROS  Renal/GU negative Renal ROS     Musculoskeletal negative musculoskeletal ROS (+)   Abdominal   Peds  Hematology negative hematology ROS (+) Lab Results      Component                Value               Date                      WBC                      15.7 (H)            01/07/2022                HGB                      12.3                01/07/2022                HCT                      34.9 (L)            01/07/2022                MCV                      89.7                01/07/2022                PLT                      219                 01/07/2022              Anesthesia Other Findings   Reproductive/Obstetrics (+) Pregnancy                             Anesthesia Physical Anesthesia Plan  ASA: 2  Anesthesia Plan: Epidural   Post-op Pain Management:    Induction:   PONV Risk Score and Plan: 2 and Treatment may vary due to age or medical condition  Airway Management Planned: Natural Airway  Additional Equipment: None  Intra-op Plan:   Post-operative Plan:   Informed Consent: I have reviewed the patients History and Physical, chart, labs and discussed the procedure including the risks, benefits and alternatives  for the proposed anesthesia with the patient or authorized representative who has indicated  his/her understanding and acceptance.       Plan Discussed with:   Anesthesia Plan Comments:         Anesthesia Quick Evaluation

## 2022-01-07 NOTE — H&P (Addendum)
Elizabeth Booker is a 26 y.o. female presenting for labor, SROM.  Pregnancy uncomplicated.  GBS positive. OB History     Gravida  1   Para      Term      Preterm      AB      Living         SAB      IAB      Ectopic      Multiple      Live Births             Past Medical History:  Diagnosis Date   Allergy    Asthma    GERD (gastroesophageal reflux disease)    Kidney infection    Migraines    Vasovagal near-syncope    Past Surgical History:  Procedure Laterality Date   ESOPHAGEAL MANOMETRY N/A 12/15/2016   Procedure: ESOPHAGEAL MANOMETRY (EM);  Surgeon: Iva Boop, MD;  Location: [facility];  Service: Endoscopy;  Laterality: N/A;   ESOPHAGOGASTRODUODENOSCOPY (EGD) WITH ESOPHAGEAL DILATION  08/30/2016   MOUTH SURGERY     PH IMPEDANCE STUDY N/A 12/15/2016   Procedure: PH IMPEDANCE STUDY;  Surgeon: Iva Boop, MD;  Location: [facility];  Service: Endoscopy;  Laterality: N/A;   WISDOM TOOTH EXTRACTION     Family History: family history includes Diabetes in her maternal grandmother; High blood pressure in her father and mother; Schizophrenia in her paternal grandfather. Social History:  reports that she has never smoked. She has never used smokeless tobacco. She reports that she does not drink alcohol and does not use drugs.     Maternal Diabetes: No Genetic Screening: Normal Maternal Ultrasounds/Referrals: Normal Fetal Ultrasounds or other Referrals:  None Maternal Substance Abuse:  No Significant Maternal Medications:  None Significant Maternal Lab Results:  Group B Strep negative Other Comments:  None  Review of Systems History Dilation: 5 Effacement (%): 90 Station: 0 Exam by:: H.Price, RN Blood pressure 122/70, pulse 86, temperature 98.1 F (36.7 C), temperature source Oral, resp. rate 18, height 5\' 3"  (1.6 m), weight 74.5 kg, last menstrual period 12/03/2020, SpO2 97 %. Exam Physical Exam  Vitals and nursing note reviewed. Exam  conducted with a chaperone present.  Constitutional:      Appearance: Normal appearance.  HENT:     Head: Normocephalic.  Eyes:     Pupils: Pupils are equal, round, and reactive to light.  Cardiovascular:     Rate and Rhythm: Normal rate and regular rhythm.     Pulses: Normal pulses.  Abdominal:     General: Abdomen is Gravid, nontender Neurological:     Mental Status: She is alert.  Prenatal labs: ABO, Rh: --/--/O NEG (06/29 0453) Antibody: POS (06/29 0453) Rubella:   RPR: NON REACTIVE (06/29 0454)  HBsAg: Negative (11/29 0000)  HIV: Non-reactive (11/29 0000)  GBS: Positive/-- (06/05 0000)   Assessment/Plan: IUP at term Active labor, now with SROM Epidural in place and working well Anticipate SVD PCN for GBS   01-18-1973 01/07/2022, 10:08 AM

## 2022-01-07 NOTE — Anesthesia Procedure Notes (Signed)
Epidural Patient location during procedure: OB Start time: 01/07/2022 8:04 AM End time: 01/07/2022 8:24 AM  Staffing Anesthesiologist: Val Eagle, MD Performed: anesthesiologist   Preanesthetic Checklist Completed: patient identified, IV checked, risks and benefits discussed, monitors and equipment checked, pre-op evaluation and timeout performed  Epidural Patient position: sitting Prep: DuraPrep Patient monitoring: heart rate, continuous pulse ox and blood pressure Approach: midline Location: L3-L4 Injection technique: LOR saline  Needle:  Needle type: Tuohy  Needle gauge: 17 G Needle length: 9 cm Needle insertion depth: 7 cm Catheter type: closed end flexible Catheter size: 19 Gauge Catheter at skin depth: 13 cm Test dose: negative and 2% lidocaine with Epi 1:200 K  Assessment Events: blood not aspirated, injection not painful, no injection resistance, no paresthesia and negative IV test  Additional Notes Reason for block:procedure for pain

## 2022-01-07 NOTE — Plan of Care (Signed)

## 2022-01-07 NOTE — MAU Note (Signed)
Pt to BR frequently due to vaginal pressure-babys head causing urgency to urinate.

## 2022-01-07 NOTE — Lactation Note (Signed)
This note was copied from a baby's chart. Lactation Consultation Note  Patient Name: Elizabeth Booker [id number] Date: 01/07/2022 Reason for consult: L&D Initial assessment;Primapara;1st time breastfeeding;Term;Breastfeeding assistance Age:25 hours  P1, Term, Infant Female  LC entered the room and baby was rooting. LC assisted with latching baby to the Lt breast in the laid back position. After multiple attempts, baby took a few sucks and came off. LC switched baby to the Rt breast and rolled the nipple. Baby latched deeply with flanged lips and fed for 10 min while LC was in the room and was still feeding when LC left.  LC showed dad how to roll the nipple to assist mom with latching baby. [facility] taught the parents how to hand express, but no drops were noted.   The parents are aware that they will be seen on MBU.   Maternal Data Has patient been taught Hand Expression?: Yes Does the patient have breastfeeding experience prior to this delivery?: No  Feeding Mother's Current Feeding Choice: Breast Milk  LATCH Score Latch: Repeated attempts needed to sustain latch, nipple held in mouth throughout feeding, stimulation needed to elicit sucking reflex.  Audible Swallowing: A few with stimulation  Type of Nipple: Flat  Comfort (Breast/Nipple): Soft / non-tender  Hold (Positioning): Assistance needed to correctly position infant at breast and maintain latch.  LATCH Score: 6   Lactation Tools Discussed/Used    Interventions Interventions: Assisted with latch;Hand express;Breast massage;Adjust position;Support pillows  Discharge    Consult Status Consult Status: Follow-up from L&D Date: 01/07/22    [name] 01/07/2022, 8:27 PM

## 2022-01-07 NOTE — MAU Note (Signed)
.  Elizabeth Booker is a 26 y.o. at [redacted]w[redacted]d here in MAU reporting: CTX since 1400 yesterday, every apart for the 2 hours. Pt states equal pain in her back and ABD with CTX. Pt lost mucus plug yesterday.  Pt denies VB, LOF, DFM, abnormal discharge, and complications in the pregnancy. Last intercourse yesterday.  SVE 1 cm Tuesday, membrane swept    Onset of complaint: 1400 yesterday  Pain score: 7/10 Vitals:   01/07/22 0301  BP: 131/86  Pulse: 77  Resp: 18  Temp: 97.6 F (36.4 C)  SpO2: 99%     FHT:110 Lab orders placed from triage:  mau labor

## 2022-01-07 NOTE — Lactation Note (Signed)
This note was copied from a baby's chart. Lactation Consultation Note  Patient Name: Elizabeth Booker [id number] Date: 01/07/2022 Reason for consult: Initial assessment;Primapara;1st time breastfeeding;Infant < 6lbs;Term;Breastfeeding assistance Age:25 hours  P1, Term, Infant Female, <6lbs, 3 hours old  LC entered the room and mom was feeding baby in the side lying position on the right breast. Per mom, she felt some pain. LC assisted mom with flipping baby's lower lip and tugging her chin. Mom states that the latch felt better. Baby came off the breast and latched herself and mom states that she felt pain again. LC pulled baby's chin. Baby fell asleep at the breast.   LC set up DEBP and mom says the size #21 flange is comfortable. LC spoke with the parents about the Late Preterm Infant feeding policy, milk storage, pump setup, pumping frequency, washing pump parts, outpatient LC services, and paced bottle feeding.   The parents are aware that feedings should be less than 30 min total.   Mom was beginning to fall asleep.   LC congratulated the family and left the room.   Current Feeding Plan:  Breastfeed baby 8+ times in 24 hours according to feeding cues.  Supplement baby according to Late Preterm Infant Policy after breastfeeding.  Keep feedings including breast and bottle feeding less than 30 min.  Wake baby if she has not eaten in 3 hours.  Pump Q 3 hrs and feed expressed milk to baby via a bottle or spoon.  Call for latch assistance if needed.    Maternal Data Has patient been taught Hand Expression?: Yes Does the patient have breastfeeding experience prior to this delivery?: No  Feeding Mother's Current Feeding Choice: Breast Milk  LATCH Score Latch: Grasps breast easily, tongue down, lips flanged, rhythmical sucking.  Audible Swallowing: A few with stimulation  Type of Nipple: Everted at rest and after stimulation  Comfort (Breast/Nipple): Soft / non-tender  Hold  (Positioning): No assistance needed to correctly position infant at breast.  LATCH Score: 9   Lactation Tools Discussed/Used Tools: Pump;Flanges Flange Size: 21 Breast pump type: Double-Electric Breast Pump Pump Education: Setup, frequency, and cleaning;Milk Storage Reason for Pumping: Infant <6lbs  Interventions Interventions: Assisted with latch;DEBP;LC Services brochure  Discharge    Consult Status Consult Status: Follow-up Date: 01/08/22 Follow-up type: In-patient    [name] 01/07/2022, 9:29 PM

## 2022-01-07 NOTE — MAU Provider Note (Signed)
Chief Complaint:  Labor Eval and Contractions   None     HPI: Elizabeth Booker is a 26 y.o. G1P0 at [redacted]w[redacted]d who presents to maternity admissions reporting contractions since 1400 on 01/06/22, becoming stronger and closer together since onset. She is feeling pelvic pressure with contractions.   She reports good fetal movement, denies LOF, vaginal bleeding, vaginal itching/burning, urinary symptoms, h/a, dizziness, n/v, or fever/chills.     HPI  Past Medical History: Past Medical History:  Diagnosis Date   Allergy    Asthma    GERD (gastroesophageal reflux disease)    Kidney infection    Migraines    Vasovagal near-syncope     Past obstetric history: OB History  Gravida Para Term Preterm AB Living  1            SAB IAB Ectopic Multiple Live Births               # Outcome Date GA Lbr Len/2nd Weight Sex Delivery Anes PTL Lv  1 Current             Past Surgical History: Past Surgical History:  Procedure Laterality Date   ESOPHAGEAL MANOMETRY N/A 12/15/2016   Procedure: ESOPHAGEAL MANOMETRY (EM);  Surgeon: Iva Boop, MD;  Location: [facility];  Service: Endoscopy;  Laterality: N/A;   ESOPHAGOGASTRODUODENOSCOPY (EGD) WITH ESOPHAGEAL DILATION  08/30/2016   MOUTH SURGERY     PH IMPEDANCE STUDY N/A 12/15/2016   Procedure: PH IMPEDANCE STUDY;  Surgeon: Iva Boop, MD;  Location: [facility];  Service: Endoscopy;  Laterality: N/A;   WISDOM TOOTH EXTRACTION      Family History: Family History  Problem Relation Age of Onset   High blood pressure Mother    High blood pressure Father    Schizophrenia Paternal Grandfather    Diabetes Maternal Grandmother    Colon cancer Neg Hx    Esophageal cancer Neg Hx    Rectal cancer Neg Hx    Stomach cancer Neg Hx    Allergic rhinitis Neg Hx    Angioedema Neg Hx    Asthma Neg Hx    Atopy Neg Hx    Eczema Neg Hx    Immunodeficiency Neg Hx    Urticaria Neg Hx    Migraines Neg Hx    Pancreatic cancer Neg Hx     Social  History: Social History   Tobacco Use   Smoking status: Never   Smokeless tobacco: Never  Vaping Use   Vaping Use: Never used  Substance Use Topics   Alcohol use: No    Alcohol/week: 0.0 standard drinks of alcohol   Drug use: No    Allergies:  Allergies  Allergen Reactions   Iohexol Swelling    Throat swelling oral , per Dr. Fredirick Lathe per Pre meds with IV contrast or do scan without //rls   Latex     Meds:  Facility-Administered Medications Prior to Admission  Medication Dose Route Frequency Provider Last Rate Last Admin   Mepolizumab SOLR 100 mg  100 mg Subcutaneous Q28 days Jessica Priest, MD   100 mg at 06/20/18 1715   Medications Prior to Admission  Medication Sig Dispense Refill Last Dose   cholecalciferol (VITAMIN D3) 25 MCG (1000 UNIT) tablet Take 1,000 Units by mouth daily.   01/06/2022   ferrous sulfate 325 (65 FE) MG EC tablet Take 325 mg by mouth 3 (three) times daily with meals.   01/06/2022   levocetirizine (XYZAL) 5 MG tablet Take  1 tablet (5 mg total) by mouth daily as needed. 90 tablet 0 01/06/2022   montelukast (SINGULAIR) 10 MG tablet TAKE ONE TABLET BY MOUTH ONCE DAILY AS DIRECTED 90 tablet 1 01/06/2022   Olopatadine HCl (PATANOL OP) Apply to eye. Uses PRN   01/06/2022   Prenatal Vit-Fe Fumarate-FA (PRENATAL MULTIVITAMIN) TABS tablet Take 1 tablet by mouth daily at 12 noon.   01/06/2022   albuterol (VENTOLIN HFA) 108 (90 Base) MCG/ACT inhaler INHALE 2 PUFFS BY MOUTH EVERY 6 HOURS AS NEEDED FOR WHEEZINGFOR SHORTNESS OF BREATH 18 g 3    budesonide (PULMICORT FLEXHALER) 180 MCG/ACT inhaler Inhale 2 puffs into the lungs in the morning and at bedtime. 1 each 5    budesonide (RHINOCORT AQUA) 32 MCG/ACT nasal spray Place 2 sprays into both nostrils daily. 9 mL 5    ciclesonide (ALVESCO) 160 MCG/ACT inhaler Inhale 1 puff into the lungs 2 (two) times daily. (Patient not taking: Reported on 10/27/2021) 7 g 11    DEXILANT 60 MG capsule TAKE 1 CAPSULE BY MOUTH EVERY MORNING  (Patient not taking: Reported on 10/27/2021) 30 capsule 5    EPINEPHRINE 0.3 mg/0.3 mL IJ SOAJ injection INJECT CONTENTS OF 1 PEN AS NEEDED FOR LIFE-THREATENING ALLERGIC REACTION 2 each 1    fluticasone (FLONASE) 50 MCG/ACT nasal spray Place 1-2 sprays into both nostrils as needed. 16 g 8    Fluticasone-Salmeterol,sensor, (AIRDUO DIGIHALER) 113-14 MCG/ACT AEPB Inhale 2 puffs into the lungs in the morning and at bedtime. 1 each 5    ipratropium-albuterol (DUONEB) 0.5-2.5 (3) MG/3ML SOLN USE 1 VIAL IN NEBULIZER EVERY 6 HOURS AS NEEDED 180 mL 2     ROS:  Review of Systems  Constitutional:  Negative for chills, fatigue and fever.  Eyes:  Negative for visual disturbance.  Respiratory:  Negative for shortness of breath.   Cardiovascular:  Negative for chest pain.  Gastrointestinal:  Positive for abdominal pain. Negative for nausea and vomiting.  Genitourinary:  Negative for difficulty urinating, dysuria, flank pain, pelvic pain, vaginal bleeding, vaginal discharge and vaginal pain.  Musculoskeletal:  Positive for back pain.  Neurological:  Negative for dizziness and headaches.  Psychiatric/Behavioral: Negative.       I have reviewed patient's Past Medical Hx, Surgical Hx, Family Hx, Social Hx, medications and allergies.   Physical Exam  Patient Vitals for the past 24 hrs:  BP Temp Temp src Pulse Resp SpO2 Height Weight  01/07/22 0355 -- -- -- -- -- 97 % -- --  01/07/22 0354 137/80 -- -- 87 -- -- -- --  01/07/22 0315 134/81 -- -- 83 -- 98 % -- --  01/07/22 0301 131/86 97.6 F (36.4 C) Oral 77 18 99 % 5\' 3"  (1.6 m) 74.5 kg   Constitutional: Well-developed, well-nourished female in no acute distress.  Cardiovascular: normal rate Respiratory: normal effort GI: Abd soft, non-tender, gravid appropriate for gestational age.  MS: Extremities nontender, no edema, normal ROM Neurologic: Alert and oriented x 4.  GU: Neg CVAT.  PELVIC EXAM:   Initial exam, pt 2/90/0 with bloody show  Pt  rechecked in ~ 35 minutes as she appeared more uncomfortable. On repeat exam cervix:  Dilation: 3.5 Effacement (%): 100 Cervical Position: Posterior Station: 0 Presentation: Vertex Exam by:: [email or web address] CNM  FHT:  Baseline 135 , moderate variability, accelerations present, no decelerations Contractions: q 2 mins, moderate to palpation   Labs: No results found for this or any previous visit (from the past 24 hour(s)). O/Negative/-- (11/29 0000)  Imaging:  No results found.  MAU Course/MDM: Orders Placed This Encounter  Procedures   CBC   RPR   Contraction - monitoring   External fetal heart monitoring   Vaginal exam   Type and screen [facility]   Insert and maintain IV Line    No orders of the defined types were placed in this encounter.    NST reviewed and reactive/Category I tracing CNM was called to room to examine the patient since her cervix was posterior and fetal head very low so it was difficult to assess cervix Cervix thin and posterior, but fetal head at 0 station and bloody show on exam Plan to recheck in 1 hour but pt becoming increasingly more uncomfortable and planning to use nitrous oxide in labor which is not available in MAU so cervix rechecked and found to be 3.5 cm and 100% effaced.   I called Dr Henderson Cloud, who admitted the patient to labor and delivery   Assessment: 1. Normal labor   2. Rh negative state in antepartum period   3. [redacted] weeks gestation of pregnancy     Plan: Admit to labor and delivery Dr Henderson Cloud to assume care   Sharen Counter Certified Nurse-Midwife 01/07/2022 4:58 AM

## 2022-01-08 LAB — CBC
HCT: 30.1 % — ABNORMAL LOW (ref 36.0–46.0)
Hemoglobin: 10.6 g/dL — ABNORMAL LOW (ref 12.0–15.0)
MCH: 31.5 pg (ref 26.0–34.0)
MCHC: 35.2 g/dL (ref 30.0–36.0)
MCV: 89.6 fL (ref 80.0–100.0)
Platelets: 179 10*3/uL (ref 150–400)
RBC: 3.36 MIL/uL — ABNORMAL LOW (ref 3.87–5.11)
RDW: 13.6 % (ref 11.5–15.5)
WBC: 17.1 10*3/uL — ABNORMAL HIGH (ref 4.0–10.5)
nRBC: 0 % (ref 0.0–0.2)

## 2022-01-08 MED ORDER — RHO D IMMUNE GLOBULIN 1500 UNIT/2ML IJ SOSY
300.0000 ug | PREFILLED_SYRINGE | Freq: Once | INTRAMUSCULAR | Status: AC
  Administered 2022-01-08: 300 ug via INTRAVENOUS
  Filled 2022-01-08: qty 2

## 2022-01-08 NOTE — Anesthesia Postprocedure Evaluation (Signed)
Anesthesia Post Note  Patient: Elizabeth Booker  Procedure(s) Performed: AN AD HOC LABOR EPIDURAL     Patient location during evaluation: Mother Baby Anesthesia Type: Epidural Level of consciousness: awake and alert Pain management: pain level controlled Vital Signs Assessment: post-procedure vital signs reviewed and stable Respiratory status: spontaneous breathing, nonlabored ventilation and respiratory function stable Cardiovascular status: stable Postop Assessment: no headache, no backache, epidural receding, no apparent nausea or vomiting, patient able to bend at knees, adequate PO intake and able to ambulate Anesthetic complications: no   No notable events documented.  Last Vitals:  Vitals:   01/08/22 0058 01/08/22 0436  BP: 131/79 109/72  Pulse: 77 80  Resp: 18 18  Temp: 36.7 C 36.8 C  SpO2: 97% 97%    Last Pain:  Vitals:   01/08/22 0522  TempSrc:   PainSc: 2    Pain Goal:                   Laban Emperor

## 2022-01-08 NOTE — Social Work (Signed)
CSW received consult for "depression early in pregnancy".  CSW met with MOB to offer support and complete assessment.    CSW met with MOB at bedside and introduced CSW role. CSW observed MOB eating lunch, FOB present at bedside and supports holding the infant. CSW offered MOB privacy. MOB family left the room and FOB stayed per [facility] request. MOB presented calm and welcomed CSW visit. CSW inquired how MOB has felt since giving birth. MOB reported that she has been feeling "a lot better." MOB reported that she has also been crying "happy tears." MOB shared the L&D was hard, but it got better. CSW inquired about history of depression. MOB reported that she experienced depression symptoms throughout the pregnancy being that was tearful. MOB reported prior to the pregnancy she did not have any concerns with depression. MOB reported that she has been feeling better and is not interested in medication or therapy at this time. MOB shared that it was her faith and talking with her spouse that helped. MOB identified her spouse and family as supports. CSW encourage MOB to continue using her coping strategies. CSW discussed PPD symptoms and offered resources.   CSW provided education regarding the baby blues period vs. perinatal mood disorders, discussed treatment and gave resources for mental health follow up if concerns arise.  CSW recommended MOB complete a self-evaluation during the postpartum time period using the New Mom Checklist from Postpartum Progress and encouraged MOB to contact a medical professional if symptoms are noted at any time. CSW assessed MOB for safety. MOB denied thoughts of harm to self and others.   MOB reported that she has essential items for the infant including a bassinet where the infant will sleep. MOB has chosen [facility] for the infant's follow up care. CSW provided review of Sudden Infant Death Syndrome (SIDS) precautions. CSW assessed MOB for additional needs. MOB reported no  further need.   CSW identifies no further need for intervention and no barriers to discharge at this time.   Kathrin Greathouse, MSW, LCSW Women's and Center Worker  [phone] 01/08/2022  4:11 PM

## 2022-01-08 NOTE — Progress Notes (Signed)
Post Partum Day 1 Subjective: no complaints, up ad lib, voiding, and tolerating PO  Objective: Blood pressure 110/64, pulse 76, temperature (!) 97.4 F (36.3 C), temperature source Oral, resp. rate 18, height 5\' 3"  (1.6 m), weight 74.5 kg, last menstrual period 12/03/2020, SpO2 98 %, unknown if currently breastfeeding.  Physical Exam:  General: alert, cooperative, appears stated age, and no distress Lochia: appropriate Uterine Fundus: firm Incision:   DVT Evaluation: No evidence of DVT seen on physical exam.  Recent Labs    01/07/22 0454 01/08/22 0437  HGB 12.3 10.6*  HCT 34.9* 30.1*    Assessment/Plan: Plan for discharge tomorrow and Breastfeeding   LOS: 1 day   01/10/22, MD 01/08/2022, 8:28 AM

## 2022-01-09 LAB — RH IG WORKUP (INCLUDES ABO/RH)
Fetal Screen: NEGATIVE
Gestational Age(Wks): 39
Unit division: 0

## 2022-01-09 MED ORDER — IBUPROFEN 600 MG PO TABS: 600.0000 mg | ORAL_TABLET | Freq: Four times a day (QID) | ORAL | 0 refills | Status: DC

## 2022-01-09 NOTE — Lactation Note (Signed)
This note was copied from a baby's chart. Lactation Consultation Note  Patient Name: Elizabeth Booker Date: 01/09/2022 Reason for consult: Follow-up assessment;Difficult latch;Primapara;1st time breastfeeding;Term;Infant < 6lbs;Infant weight loss;Breastfeeding assistance (3.41% [facility]) Age:25 hours  P1, Term, Infant Female, <6lbs, 3.41%[facility]  LC entered the room and mom states that breastfeeding is getting better. She states that baby is doing better on the right breast than the left. She says that she has been having a hard time latching on the left. LC assisted mom with latching baby to the left breast by rolling the nipple and hand expressing prior to latching baby. Baby latched deeply with flanged lips and some swallows were noted. Mom states that she does not have any pain.  Mom states that she has not been pumping, but she has not been as frequently as she should. Mom pumped 41mL 2 hours ago. LC encouraged mom to hand express and pump frequently for stimulation.   LC asked if mom would like to be contacted by outpatient LC. LC sent the message to the outpatient LC.   Mom will call RN/LC for latch assistance.    Feeding Nipple Type: Nfant Slow Flow (purple)  LATCH Score Latch: Grasps breast easily, tongue down, lips flanged, rhythmical sucking.  Audible Swallowing: A few with stimulation  Type of Nipple: Flat (Right everted, left short shafted)  Comfort (Breast/Nipple): Soft / non-tender  Hold (Positioning): Assistance needed to correctly position infant at breast and maintain latch.  LATCH Score: 7   Lactation Tools Discussed/Used    Interventions Interventions: Breast feeding basics reviewed;Assisted with latch;Adjust position;Support pillows;Hand express  Discharge Discharge Education: Outpatient Epic message sent  Consult Status Consult Status: Follow-up Date: 01/10/22 Follow-up type: In-patient    Elizabeth Booker 01/09/2022, 2:15 PM

## 2022-01-09 NOTE — Discharge Summary (Signed)
Postpartum Discharge Summary    Patient Name: Elizabeth Booker DOB: 12/14/1995 MRN: [id number]  Date of admission: 01/07/2022 Delivery date:01/07/2022  Delivering provider: Louretta Shorten  Date of discharge: 01/09/2022  Admitting diagnosis: Term pregnancy [Z34.90] Intrauterine pregnancy: [redacted]w[redacted]d    Secondary diagnosis:  Principal Problem:   Term pregnancy  Additional problems: none    Discharge diagnosis: Term Pregnancy Delivered                                              Post partum procedures:rhogam Augmentation: N/A Complications: None  Hospital course: Onset of Labor With Vaginal Delivery      26y.o. yo G1P1001 at 330w3das admitted in Active Labor on 01/07/2022. Patient had an uncomplicated labor course as follows:  Membrane Rupture Time/Date: 6:37 AM ,01/07/2022   Delivery Method:Vaginal, Spontaneous  Episiotomy: None  Lacerations:  1st degree;Sulcus  Patient had an uncomplicated postpartum course.  She is ambulating, tolerating a regular diet, passing flatus, and urinating well. Patient is discharged home in stable condition on 01/09/22.  Newborn Data: Birth date:01/07/2022  Birth time:6:02 PM  Gender:Female  Living status:Living  Apgars:9 ,9  Weight:2490 g   Magnesium Sulfate received: No BMZ received: No Rhophylac:Yes MMR:No T-DaP:Given prenatally Flu: No Transfusion:No  Physical exam  Vitals:   01/08/22 0817 01/08/22 1437 01/08/22 2055 01/09/22 0538  BP: 110/64 106/72 (!) 114/56 129/66  Pulse: 76 79 77 75  Resp: '18 18 20 18  ' Temp: (!) 97.4 F (36.3 C) 98 F (36.7 C) 97.9 F (36.6 C) 98.4 F (36.9 C)  TempSrc: Oral Oral Oral Oral  SpO2: 98% 100%  98%  Weight:      Height:       General: alert, cooperative, and no distress Lochia: appropriate Uterine Fundus: firm Incision: Healing well with no significant drainage, No significant erythema DVT Evaluation: No evidence of DVT seen on physical exam. Negative Homan's sign. No cords or calf  tenderness. Labs: Lab Results  Component Value Date   WBC 17.1 (H) 01/08/2022   HGB 10.6 (L) 01/08/2022   HCT 30.1 (L) 01/08/2022   MCV 89.6 01/08/2022   PLT 179 01/08/2022      Latest Ref Rng & Units 08/13/2020    8:54 AM  CMP  Glucose 65 - 99 mg/dL 90   BUN 6 - 20 mg/dL 13   Creatinine 0.57 - 1.00 mg/dL 0.87   Sodium 134 - 144 mmol/L 140   Potassium 3.5 - 5.2 mmol/L 4.3   Chloride 96 - 106 mmol/L 105   CO2 20 - 29 mmol/L 22   Calcium 8.7 - 10.2 mg/dL 9.3   Total Protein 6.0 - 8.5 g/dL 7.4   Total Bilirubin 0.0 - 1.2 mg/dL 0.3   Alkaline Phos 44 - 121 IU/L 61   AST 0 - 40 IU/L 18   ALT 0 - 32 IU/L 13    Edinburgh Score:     No data to display           After visit meds:  Allergies as of 01/09/2022       Reactions   Iohexol Swelling   Throat swelling oral , per Dr. BlRosario Jackser Pre meds with IV contrast or do scan without //rls   Latex         Medication List     STOP taking  these medications    ferrous sulfate 325 (65 FE) MG EC tablet       TAKE these medications    AirDuo Digihaler 113-14 MCG/ACT Aepb Generic drug: Fluticasone-Salmeterol(sensor) Inhale 2 puffs into the lungs in the morning and at bedtime.   albuterol 108 (90 Base) MCG/ACT inhaler Commonly known as: VENTOLIN HFA INHALE 2 PUFFS BY MOUTH EVERY 6 HOURS AS NEEDED FOR WHEEZINGFOR SHORTNESS OF BREATH   Alvesco 160 MCG/ACT inhaler Generic drug: ciclesonide Inhale 1 puff into the lungs 2 (two) times daily.   budesonide 32 MCG/ACT nasal spray Commonly known as: RHINOCORT AQUA Place 2 sprays into both nostrils daily.   cholecalciferol 25 MCG (1000 UNIT) tablet Commonly known as: VITAMIN D3 Take 1,000 Units by mouth daily.   Dexilant 60 MG capsule Generic drug: dexlansoprazole TAKE 1 CAPSULE BY MOUTH EVERY MORNING   EPINEPHrine 0.3 mg/0.3 mL Soaj injection Commonly known as: EPI-PEN INJECT CONTENTS OF 1 PEN AS NEEDED FOR LIFE-THREATENING ALLERGIC REACTION   fluticasone 50  MCG/ACT nasal spray Commonly known as: FLONASE Place 1-2 sprays into both nostrils as needed.   ibuprofen 600 MG tablet Commonly known as: ADVIL Take 1 tablet (600 mg total) by mouth every 6 (six) hours.   ipratropium-albuterol 0.5-2.5 (3) MG/3ML Soln Commonly known as: DUONEB USE 1 VIAL IN NEBULIZER EVERY 6 HOURS AS NEEDED   levocetirizine 5 MG tablet Commonly known as: XYZAL Take 1 tablet (5 mg total) by mouth daily as needed.   montelukast 10 MG tablet Commonly known as: SINGULAIR TAKE ONE TABLET BY MOUTH ONCE DAILY AS DIRECTED   PATANOL OP Apply to eye. Uses PRN   prenatal multivitamin Tabs tablet Take 1 tablet by mouth daily at 12 noon.   Pulmicort Flexhaler 180 MCG/ACT inhaler Generic drug: budesonide Inhale 2 puffs into the lungs in the morning and at bedtime.         Discharge home in stable condition Infant Feeding: Breast Infant Disposition:home with mother Discharge instruction: per After Visit Summary and Postpartum booklet. Activity: Advance as tolerated. Pelvic rest for 6 weeks.  Diet: routine diet Future Appointments:PPV in 6 weeks Future Appointments  Date Time Provider [place]  05/04/2022  4:00 PM Kozlow, Donnamarie Poag, MD [facility] None   01/09/2022 Linda Hedges, DO

## 2022-01-09 NOTE — Progress Notes (Signed)
Post Partum Day 2 Subjective: no complaints, up ad lib, voiding, and tolerating PO  Objective: Blood pressure 129/66, pulse 75, temperature 98.4 F (36.9 C), temperature source Oral, resp. rate 18, height 5\' 3"  (1.6 m), weight 74.5 kg, last menstrual period 12/03/2020, SpO2 98 %, unknown if currently breastfeeding.  Physical Exam:  General: alert, cooperative, and appears stated age Lochia: appropriate Uterine Fundus: firm Incision: healing well, no significant drainage DVT Evaluation: No evidence of DVT seen on physical exam. Negative Homan's sign. No cords or calf tenderness.  Recent Labs    01/07/22 0454 01/08/22 0437  HGB 12.3 10.6*  HCT 34.9* 30.1*    Assessment/Plan: Discharge home and Breastfeeding   LOS: 2 days   01/10/22, DO 01/09/2022, 7:09 AM

## 2022-01-09 NOTE — Discharge Instructions (Signed)
Call MD for T>100.4, heavy vaginal bleeding, severe abdominal pain, intractable nausea and/or vomiting, or respiratory distress.  Call office to schedule postpartum visit in 6 weeks.  Pelvic rest x 6 weeks.   

## 2022-01-10 ENCOUNTER — Ambulatory Visit: Payer: Self-pay

## 2022-01-10 NOTE — Lactation Note (Signed)
This note was copied from a baby's chart. Lactation Consultation Note  Patient Name: Girl Nayah Lukens ZGYFV'C Date: 01/10/2022 Reason for consult: Follow-up assessment;1st time breastfeeding;Primapara;Infant < 6lbs;Term Age:25 hours  LC in to visit with P1 Mom of term baby weighing <6 lbs at birth.  Baby gained 35 gm from yesterday and is currently at a 2% weight loss.  Baby has been going to the breast and then supplementing with 22 cal formula by paced bottle.   Baby being examined by Barnetta Chapel NP and started showing feeding readiness.  Mom agreeable to [facility] assist/assessment.  Mom had reported baby being sleepy on the breast.  Mom's breasts notably fuller today, filling and compressible.  Mom using cross cradle hold, adjusted breast support to a U hold to help baby sustain a deep latch.  Baby latched easily.  Showed FOB (very involved) how to tug on baby's chin to flange lower lip.  Baby fed with deep jaw extensions and swallows consistently for 10 mins.  Breast noticeably softer when baby came off and nipple rounded.  Assisted Mom to pump, provided the hands free pumping top.    Talked to parents regarding the importance of an OP lactation appt this week.  Message sent to S. Hice RN IBCLC at the [facility].    Plan recommended- 1- STS as much as possible 2-Offer the breast with feeding cues, or awaken baby after 3 hrs for a feeding 3-If breastfeeding well, offer EBM by paced bottle after feeding 4-If baby was sleepy and didn't feed well, Mom will offer more supplement volume adding 22 cal formula to EBM prn. 5- Pump both breasts 15-30 mins.  Engorgement prevention and treatment reviewed.  Mom encouraged to call prn for concerns. Mom aware of OP lactation support available to her.  LATCH Score Latch: Grasps breast easily, tongue down, lips flanged, rhythmical sucking.  Audible Swallowing: Spontaneous and intermittent  Type of Nipple: Everted at rest and after  stimulation  Comfort (Breast/Nipple): Soft / non-tender (Breasts are filling)  Hold (Positioning): Assistance needed to correctly position infant at breast and maintain latch.  LATCH Score: 9   Lactation Tools Discussed/Used Tools: Pump;Flanges;Bottle Flange Size: 24 Breast pump type: Double-Electric Breast Pump;Manual Pump Education: Setup, frequency, and cleaning;Milk Storage Reason for Pumping: Support milk supply/<6 lbs infant Pumping frequency: Mom has been consistently pumping every 3 hrs Pumped volume: 19 mL  Interventions Interventions: Breast feeding basics reviewed;Assisted with latch;Skin to skin;Breast massage;Hand express;Support pillows;Adjust position;Breast compression;Position options;Hand pump;DEBP;Education;Pace feeding;Expressed milk  Discharge Discharge Education: Engorgement and breast care;Warning signs for feeding baby;Outpatient recommendation;Outpatient Epic message sent Pump: Personal (Spectra DEBP at home)  Consult Status Consult Status: Complete Date: 01/10/22 Follow-up type: Out-patient    Judee Clara 01/10/2022, 12:44 PM

## 2022-01-13 ENCOUNTER — Encounter (HOSPITAL_COMMUNITY): Payer: Self-pay | Admitting: Obstetrics and Gynecology

## 2022-01-14 ENCOUNTER — Inpatient Hospital Stay (HOSPITAL_COMMUNITY)
Admission: AD | Admit: 2022-01-14 | Payer: BC Managed Care – PPO | Source: Home / Self Care | Admitting: Obstetrics & Gynecology

## 2022-01-14 ENCOUNTER — Inpatient Hospital Stay (HOSPITAL_COMMUNITY): Payer: BC Managed Care – PPO

## 2022-01-15 ENCOUNTER — Telehealth (HOSPITAL_COMMUNITY): Payer: Self-pay | Admitting: *Deleted

## 2022-01-15 NOTE — Telephone Encounter (Signed)
Left phone voicemail message.  Duffy Rhody, RN 01-15-2022 at 9:36am

## 2022-02-03 ENCOUNTER — Encounter (HOSPITAL_COMMUNITY): Payer: Self-pay | Admitting: Emergency Medicine

## 2022-02-03 ENCOUNTER — Ambulatory Visit (HOSPITAL_COMMUNITY)
Admission: EM | Admit: 2022-02-03 | Discharge: 2022-02-03 | Disposition: A | Discharge status: Home / Self Care | Payer: BC Managed Care – PPO | Attending: Family Medicine | Admitting: Family Medicine

## 2022-02-03 DIAGNOSIS — R509 Fever, unspecified: Secondary | ICD-10-CM

## 2022-02-03 DIAGNOSIS — N61 Mastitis without abscess: Secondary | ICD-10-CM

## 2022-02-03 MED ORDER — IBUPROFEN 800 MG PO TABS
ORAL_TABLET | ORAL | Status: AC
  Filled 2022-02-03: qty 1

## 2022-02-03 MED ORDER — ONDANSETRON 4 MG PO TBDP
ORAL_TABLET | ORAL | Status: AC
  Filled 2022-02-03: qty 1

## 2022-02-03 MED ORDER — ONDANSETRON 4 MG PO TBDP
4.0000 mg | ORAL_TABLET | Freq: Once | ORAL | Status: AC
  Administered 2022-02-03: 4 mg via ORAL

## 2022-02-03 MED ORDER — IBUPROFEN 600 MG PO TABS: 600.0000 mg | ORAL_TABLET | Freq: Four times a day (QID) | ORAL | 0 refills | Status: DC | PRN

## 2022-02-03 MED ORDER — ONDANSETRON 4 MG PO TBDP: 4.0000 mg | ORAL_TABLET | Freq: Three times a day (TID) | ORAL | 0 refills | Status: DC | PRN

## 2022-02-03 MED ORDER — AMOXICILLIN-POT CLAVULANATE 875-125 MG PO TABS: 1.0000 | ORAL_TABLET | Freq: Two times a day (BID) | ORAL | 0 refills | Status: DC

## 2022-02-03 MED ORDER — IBUPROFEN 800 MG PO TABS
800.0000 mg | ORAL_TABLET | Freq: Once | ORAL | Status: AC
  Administered 2022-02-03: 800 mg via ORAL

## 2022-02-03 NOTE — Discharge Instructions (Addendum)
You have mastitis.  This is an infection related to a clogged milk duct in the breast.  You may apply ice to the area to reduce inflammation.  We gave you ibuprofen 800 mg in the clinic today.  You may take 600 mg of ibuprofen every 6 hours at home with food as needed for pain and inflammation.   Start taking Augmentin twice daily for the next 7 days to treat infection.  Take this medicine with food to avoid stomach upset.  We gave you a dose of Zofran in the clinic.  You may have Zofran every 8 hours.  Your next dose of ibuprofen may be tomorrow morning overnight at 3:30 AM and your next dose of Zofran may be overnight at 3:30 AM as well if needed.   If you develop any new or worsening symptoms or do not improve in the next 2 to 3 days, please return.  If your symptoms are severe, please go to the emergency room.  Follow-up with your primary care provider for further evaluation and management of your symptoms as well as ongoing wellness visits.  I hope you feel better!

## 2022-02-03 NOTE — ED Triage Notes (Addendum)
Pt reports had fever, chills, red area and knot on right breast that all started 4 hours ago. Denies taking any medications. Pt is breastfeeding

## 2022-02-03 NOTE — ED Provider Notes (Signed)
[facility]    CSN: [id number] Arrival date & time: 02/03/22  1857      History   Chief Complaint Chief Complaint  Patient presents with   Fever   Chills   Breast Mass    HPI Elizabeth Booker is a 26 y.o. female.   Patient presents to urgent care for evaluation of right breast "knot" that started approximately 4 hours ago as well as fever/chills at home.  Right breast is very red, swollen, and very tender.  Patient is breast-feeding her infant daughter and reports she has not been able to pump much out of her right breast since symptoms started. She is nauseous and reports a headache. No vomiting, blurry vision, decreased visual acuity, or dizziness reported. Pain to the right breast intermittently radiates to the right arm. Denies URI symptoms. No recent sick contacts. She has not attempted use of any over the counter medications prior to arrival at urgent care.    Fever   Past Medical History:  Diagnosis Date   Allergy    Asthma    GERD (gastroesophageal reflux disease)    Kidney infection    Migraines    Vasovagal near-syncope     Patient Active Problem List   Diagnosis Date Noted   Term pregnancy 01/07/2022   Migraines    Near syncope 08/13/2020   Migraine with aura and without status migrainosus, not intractable 08/13/2020   Seasonal and perennial allergic rhinitis 08/18/2017   Seasonal allergic conjunctivitis 08/18/2017   Severe persistent asthma, uncomplicated 08/18/2017   Gastroesophageal reflux disease 08/18/2017   Food allergy 08/18/2017   Heartburn    Asthma with acute exacerbation 08/05/2015   Allergic rhinoconjunctivitis 08/05/2015   Acute sinusitis 08/05/2015    Past Surgical History:  Procedure Laterality Date   ESOPHAGEAL MANOMETRY N/A 12/15/2016   Procedure: ESOPHAGEAL MANOMETRY (EM);  Surgeon: Iva Boop, MD;  Location: [facility];  Service: Endoscopy;  Laterality: N/A;   ESOPHAGOGASTRODUODENOSCOPY (EGD) WITH ESOPHAGEAL  DILATION  08/30/2016   MOUTH SURGERY     PH IMPEDANCE STUDY N/A 12/15/2016   Procedure: PH IMPEDANCE STUDY;  Surgeon: Iva Boop, MD;  Location: [facility];  Service: Endoscopy;  Laterality: N/A;   WISDOM TOOTH EXTRACTION      OB History     Gravida  1   Para  1   Term  1   Preterm      AB      Living  1      SAB      IAB      Ectopic      Multiple  0   Live Births  1            Home Medications    Prior to Admission medications   Medication Sig Start Date End Date Taking? Authorizing Provider  amoxicillin-clavulanate (AUGMENTIN) 875-125 MG tablet Take 1 tablet by mouth every 12 (twelve) hours. 02/03/22  Yes Carlisle Beers, FNP  ibuprofen (ADVIL) 600 MG tablet Take 1 tablet (600 mg total) by mouth every 6 (six) hours as needed. 02/03/22  Yes Carlisle Beers, FNP  ondansetron (ZOFRAN-ODT) 4 MG disintegrating tablet Take 1 tablet (4 mg total) by mouth every 8 (eight) hours as needed for nausea or vomiting. 02/03/22  Yes Carlisle Beers, FNP  albuterol (VENTOLIN HFA) 108 (90 Base) MCG/ACT inhaler INHALE 2 PUFFS BY MOUTH EVERY 6 HOURS AS NEEDED FOR [facility] SHORTNESS OF BREATH 08/12/20   Kozlow, Alvira Philips,  MD  budesonide (PULMICORT FLEXHALER) 180 MCG/ACT inhaler Inhale 2 puffs into the lungs in the morning and at bedtime. 05/19/21   Kozlow, Alvira Philips, MD  budesonide (RHINOCORT AQUA) 32 MCG/ACT nasal spray Place 2 sprays into both nostrils daily. 05/19/21   Kozlow, Alvira Philips, MD  cholecalciferol (VITAMIN D3) 25 MCG (1000 UNIT) tablet Take 1,000 Units by mouth daily.    [provider]  ciclesonide (ALVESCO) 160 MCG/ACT inhaler Inhale 1 puff into the lungs 2 (two) times daily. Patient not taking: Reported on 10/27/2021 08/12/20   Jessica Priest, MD  DEXILANT 60 MG capsule TAKE 1 CAPSULE BY MOUTH EVERY MORNING Patient not taking: Reported on 10/27/2021 11/13/20   Jessica Priest, MD  EPINEPHRINE 0.3 mg/0.3 mL IJ SOAJ injection INJECT CONTENTS OF 1 PEN AS  NEEDED FOR LIFE-THREATENING ALLERGIC REACTION 08/06/19   Kozlow, Alvira Philips, MD  fluticasone (FLONASE) 50 MCG/ACT nasal spray Place 1-2 sprays into both nostrils as needed. 08/12/20   Kozlow, Alvira Philips, MD  Fluticasone-Salmeterol,sensor, [facility]) [phone] MCG/ACT AEPB Inhale 2 puffs into the lungs in the morning and at bedtime. 10/01/19   Kozlow, Alvira Philips, MD  ipratropium-albuterol (DUONEB) 0.5-2.5 (3) MG/3ML SOLN USE 1 VIAL IN NEBULIZER EVERY 6 HOURS AS NEEDED 08/12/20   Kozlow, Alvira Philips, MD  levocetirizine (XYZAL) 5 MG tablet Take 1 tablet (5 mg total) by mouth daily as needed. 07/14/21   Kozlow, Alvira Philips, MD  montelukast (SINGULAIR) 10 MG tablet TAKE ONE TABLET BY MOUTH ONCE DAILY AS DIRECTED 10/21/21   Kozlow, Alvira Philips, MD  Olopatadine HCl (PATANOL OP) Apply to eye. Uses PRN    [provider]  Prenatal Vit-Fe Fumarate-FA (PRENATAL MULTIVITAMIN) TABS tablet Take 1 tablet by mouth daily at 12 noon.    [provider]    Family History Family History  Problem Relation Age of Onset   High blood pressure Mother    High blood pressure Father    Schizophrenia Paternal Grandfather    Diabetes Maternal Grandmother    Colon cancer Neg Hx    Esophageal cancer Neg Hx    Rectal cancer Neg Hx    Stomach cancer Neg Hx    Allergic rhinitis Neg Hx    Angioedema Neg Hx    Asthma Neg Hx    Atopy Neg Hx    Eczema Neg Hx    Immunodeficiency Neg Hx    Urticaria Neg Hx    Migraines Neg Hx    Pancreatic cancer Neg Hx     Social History Social History   Tobacco Use   Smoking status: Never   Smokeless tobacco: Never  Vaping Use   Vaping Use: Never used  Substance Use Topics   Alcohol use: No    Alcohol/week: 0.0 standard drinks of alcohol   Drug use: No     Allergies   Iohexol and Latex   Review of Systems Review of Systems  Constitutional:  Positive for fever.  Per HPI   Physical Exam Triage Vital Signs ED Triage Vitals  Enc Vitals Group     BP 02/03/22 1905 119/66      Pulse Rate 02/03/22 1905 (!) 112     Resp 02/03/22 1905 15     Temp 02/03/22 1905 (!) 101.2 F (38.4 C)     Temp Source 02/03/22 1905 Oral     SpO2 02/03/22 1905 100 %     Weight --      Height --  Head Circumference --      Peak Flow --      Pain Score 02/03/22 1904 5     Pain Loc --      Pain Edu? --      Excl. in GC? --    No data found.  Updated Vital Signs BP 119/66 (BP Location: Left Arm)   Pulse (!) 112   Temp (!) 101.2 F (38.4 C) (Oral)   Resp 15   LMP 12/03/2020   SpO2 100%   Breastfeeding Yes   Visual Acuity Right Eye Distance:   Left Eye Distance:   Bilateral Distance:    Right Eye Near:   Left Eye Near:    Bilateral Near:     Physical Exam Vitals and nursing note reviewed. Chaperone present: Diane, Charity fundraiser.  Constitutional:      Appearance: Normal appearance. She is not ill-appearing or toxic-appearing.     Comments: Very pleasant patient sitting on exam in position of comfort table in no acute distress.   HENT:     Head: Normocephalic and atraumatic.     Right Ear: Hearing and external ear normal.     Left Ear: Hearing and external ear normal.     Nose: Nose normal.     Mouth/Throat:     Lips: Pink.     Mouth: Mucous membranes are moist.  Eyes:     General: Lids are normal. Vision grossly intact. Gaze aligned appropriately.     Extraocular Movements: Extraocular movements intact.     Conjunctiva/sclera: Conjunctivae normal.  Pulmonary:     Effort: Pulmonary effort is normal.  Chest:       Comments: Area of redness and swelling to the right breast tissue outlined above in red with image. No bleeding, inverted nipples, masses, or nipple discharge bilaterally. Left breast appears normal and is without skin changes. Right breast is warm to the touch at the area of greatest tenderness at the 1 o'clock position to the nipple. Skin is mildly erythematous and there is an area of soft tissue swelling beneath area of redness.  Abdominal:     Palpations:  Abdomen is soft.  Musculoskeletal:     Cervical back: Neck supple.  Lymphadenopathy:     Upper Body:     Right upper body: No supraclavicular or axillary adenopathy.     Left upper body: No supraclavicular or axillary adenopathy.  Skin:    General: Skin is warm and dry.     Capillary Refill: Capillary refill takes less than 2 seconds.     Findings: No rash.  Neurological:     General: No focal deficit present.     Mental Status: She is alert and oriented to person, place, and time. Mental status is at baseline.     Cranial Nerves: No dysarthria or facial asymmetry.     Gait: Gait is intact.  Psychiatric:        Mood and Affect: Mood normal.        Speech: Speech normal.        Behavior: Behavior normal.        Thought Content: Thought content normal.        Judgment: Judgment normal.      UC Treatments / Results  Labs (all labs ordered are listed, but only abnormal results are displayed) Labs Reviewed - No data to display  EKG   Radiology No results found.  Procedures Procedures (including critical care time)  Medications Ordered in UC Medications  ibuprofen (ADVIL) tablet 800 mg (800 mg Oral Given 02/03/22 1922)  ondansetron (ZOFRAN-ODT) disintegrating tablet 4 mg (4 mg Oral Given 02/03/22 1928)    Initial Impression / Assessment and Plan / UC Course  I have reviewed the triage vital signs and the nursing notes.  Pertinent labs & imaging results that were available during my care of the patient were reviewed by me and considered in my medical decision making (see chart for details).   1. Mastitis Symptomology and physical exam are consistent with acute mastitis of the right breast. Patient is febrile at 101.2 degrees and tachycardic at 112 in the clinic. She is not in acute distress at this time. Ibuprofen 800mg  given in clinic for inflammation and pain associated with mastitis. Zofran 4mg  given prior to discharge from urgent care for nausea. She may take more of  these medicines at home at 0330 tomorrow morning if needed. Prescription for zofran 4mg  ODT every 8 hours and ibuprofen 600mg  given for nausea/vomiting and fever, chills, and inflammation. Ibuprofen to be taken with food. Bland diet recommended for the next 12-24 hours. Augmentin twice daily for the next 7 days prescribed to treat acute mastitis. No allergies to antibiotics reported. Patient to ice area intermittently to reduce inflammation to the right breast further. Patient given information in AVS regarding mastitis.   Discussed physical exam and available lab work findings in clinic with patient.  Counseled patient regarding appropriate use of medications and potential side effects for all medications recommended or prescribed today. Discussed red flag signs and symptoms of worsening condition,when to call the PCP office, return to urgent care, and when to seek higher level of care in the emergency department. Patient verbalizes understanding and agreement with plan. All questions answered. Patient discharged in stable condition.  Final Clinical Impressions(s) / UC Diagnoses   Final diagnoses:  Mastitis  Fever, unspecified     Discharge Instructions      You have mastitis.  This is an infection related to a clogged milk duct in the breast.  You may apply ice to the area to reduce inflammation.  We gave you ibuprofen 800 mg in the clinic today.  You may take 600 mg of ibuprofen every 6 hours at home with food as needed for pain and inflammation.   Start taking Augmentin twice daily for the next 7 days to treat infection.  Take this medicine with food to avoid stomach upset.  We gave you a dose of Zofran in the clinic.  You may have Zofran every 8 hours.  Your next dose of ibuprofen may be tomorrow morning overnight at 3:30 AM and your next dose of Zofran may be overnight at 3:30 AM as well if needed.   If you develop any new or worsening symptoms or do not improve in the next 2 to 3 days,  please return.  If your symptoms are severe, please go to the emergency room.  Follow-up with your primary care provider for further evaluation and management of your symptoms as well as ongoing wellness visits.  I hope you feel better!    ED Prescriptions     Medication Sig Dispense Auth. Provider   ondansetron (ZOFRAN-ODT) 4 MG disintegrating tablet Take 1 tablet (4 mg total) by mouth every 8 (eight) hours as needed for nausea or vomiting. 20 tablet Carlisle BeersStanhope, Timohty Renbarger M, FNP   amoxicillin-clavulanate (AUGMENTIN) 875-125 MG tablet Take 1 tablet by mouth every 12 (twelve) hours. 14 tablet Carlisle BeersStanhope, Eulises Kijowski M, FNP   ibuprofen (  ADVIL) 600 MG tablet Take 1 tablet (600 mg total) by mouth every 6 (six) hours as needed. 30 tablet Carlisle Beers, FNP      PDMP not reviewed this encounter.   Carlisle Beers, [place] 02/03/22 1954

## 2022-04-20 ENCOUNTER — Other Ambulatory Visit: Payer: Self-pay | Admitting: Allergy and Immunology

## 2022-05-04 ENCOUNTER — Ambulatory Visit: Payer: BC Managed Care – PPO | Admitting: Allergy and Immunology

## 2022-06-01 ENCOUNTER — Other Ambulatory Visit: Payer: Self-pay | Admitting: Allergy and Immunology

## 2022-07-13 ENCOUNTER — Ambulatory Visit: Payer: BC Managed Care – PPO | Admitting: Allergy and Immunology

## 2022-09-07 ENCOUNTER — Ambulatory Visit: Payer: BC Managed Care – PPO | Admitting: Allergy and Immunology

## 2022-10-22 ENCOUNTER — Other Ambulatory Visit: Payer: Self-pay | Admitting: Allergy and Immunology

## 2022-11-21 ENCOUNTER — Other Ambulatory Visit: Payer: Self-pay | Admitting: Allergy and Immunology

## 2023-02-01 ENCOUNTER — Other Ambulatory Visit: Payer: Self-pay

## 2023-02-01 ENCOUNTER — Encounter: Payer: Self-pay | Admitting: Allergy and Immunology

## 2023-02-01 ENCOUNTER — Ambulatory Visit (INDEPENDENT_AMBULATORY_CARE_PROVIDER_SITE_OTHER): Payer: Self-pay | Admitting: Allergy and Immunology

## 2023-02-01 VITALS — BP 100/60 | HR 84 | Temp 98.7°F | Ht 63.0 in | Wt 130.4 lb

## 2023-02-01 DIAGNOSIS — J4550 Severe persistent asthma, uncomplicated: Secondary | ICD-10-CM

## 2023-02-01 DIAGNOSIS — T781XXD Other adverse food reactions, not elsewhere classified, subsequent encounter: Secondary | ICD-10-CM

## 2023-02-01 DIAGNOSIS — J3089 Other allergic rhinitis: Secondary | ICD-10-CM

## 2023-02-01 MED ORDER — LEVOCETIRIZINE DIHYDROCHLORIDE 5 MG PO TABS: 5.0000 mg | ORAL_TABLET | Freq: Every day | ORAL | 3 refills | Status: AC | PRN

## 2023-02-01 MED ORDER — AIRSUPRA 90-80 MCG/ACT IN AERO: 2.0000 | INHALATION_SPRAY | RESPIRATORY_TRACT | 1 refills | Status: AC | PRN

## 2023-02-01 MED ORDER — MONTELUKAST SODIUM 10 MG PO TABS: 10.0000 mg | ORAL_TABLET | Freq: Every evening | ORAL | 1 refills | Status: DC

## 2023-02-01 NOTE — Progress Notes (Unsigned)
[place] [facility] [place] [facility] [place] [facility] [place] [facility] [place]   Follow-up Note  Referring Provider: No ref. provider found Primary Provider: Pcp, No Date of Office Visit: 02/01/2023  Subjective:   Elizabeth Booker (DOB: August 30, 1995) is a 27 y.o. female who [facility] in re-evaluation of the following:  HPI: Leathia returns to this clinic in evaluation of asthma, allergic rhinitis, food allergy directed against fruits and onion, and history of reflux.  I last saw her in this clinic 27 October 2021.  She has really done well since the delivery of her baby approximately 1 year ago.  She has had no problems with her asthma and does not use the short acting bronchodilator and can exercise without any problem and does not use a controller agent.  She has no problems with her nose and does not really need to use any Singulair at this point and occasionally uses an antihistamine.  She has not required a systemic steroid or antibiotic for any type of airway issue.  Her reflux was really bad during pregnancy but at this point in time she has minimal reflux issues and does not require any therapy.  She can now eat foods that bothered her in the past including watermelon and cucumber and onions.  Allergies as of 02/01/2023       Reactions   Iohexol Swelling   Throat swelling oral , per Dr. Fredirick Lathe per Pre meds with IV contrast or do scan without //rls   Latex         Medication List    AirDuo Digihaler 113-14 MCG/ACT Aepb Generic drug: Fluticasone-Salmeterol(sensor) Inhale 2 puffs into the lungs in the morning and at bedtime.   albuterol 108 (90 Base) MCG/ACT inhaler Commonly known as: VENTOLIN HFA INHALE 2 PUFFS BY MOUTH EVERY 6 HOURS AS NEEDED FOR WHEEZINGFOR SHORTNESS OF BREATH   Alvesco 160 MCG/ACT inhaler Generic drug: ciclesonide Inhale 1 puff into the lungs 2 (two) times daily.   amoxicillin-clavulanate 875-125 MG tablet Commonly known  as: AUGMENTIN Take 1 tablet by mouth every 12 (twelve) hours.   budesonide 32 MCG/ACT nasal spray Commonly known as: RHINOCORT AQUA Place 2 sprays into both nostrils daily.   cholecalciferol 25 MCG (1000 UNIT) tablet Commonly known as: VITAMIN D3 Take 1,000 Units by mouth daily.   Dexilant 60 MG capsule Generic drug: dexlansoprazole TAKE 1 CAPSULE BY MOUTH EVERY MORNING   EPINEPHrine 0.3 mg/0.3 mL Soaj injection Commonly known as: EPI-PEN INJECT CONTENTS OF 1 PEN AS NEEDED FOR LIFE-THREATENING ALLERGIC REACTION   fluticasone 50 MCG/ACT nasal spray Commonly known as: FLONASE Place 1-2 sprays into both nostrils as needed.   ibuprofen 600 MG tablet Commonly known as: ADVIL Take 1 tablet (600 mg total) by mouth every 6 (six) hours as needed.   ipratropium-albuterol 0.5-2.5 (3) MG/3ML Soln Commonly known as: DUONEB USE 1 VIAL IN NEBULIZER EVERY 6 HOURS AS NEEDED   levocetirizine 5 MG tablet Commonly known as: XYZAL TAKE ONE TABLET BY MOUTH ONCE DAILY AS NEEDED   montelukast 10 MG tablet Commonly known as: SINGULAIR TAKE ONE TABLET BY MOUTH ONCE DAILY AS DIRECTED   ondansetron 4 MG disintegrating tablet Commonly known as: ZOFRAN-ODT Take 1 tablet (4 mg total) by mouth every 8 (eight) hours as needed for nausea or vomiting.   PATANOL OP Apply to eye. Uses PRN   prenatal multivitamin Tabs tablet Take 1 tablet by mouth daily at 12 noon.   Pulmicort Flexhaler  180 MCG/ACT inhaler Generic drug: budesonide Inhale 2 puffs into the lungs in the morning and at bedtime.    Past Medical History:  Diagnosis Date   Allergy    Asthma    GERD (gastroesophageal reflux disease)    Kidney infection    Migraines    Vasovagal near-syncope     Past Surgical History:  Procedure Laterality Date   ESOPHAGEAL MANOMETRY N/A 12/15/2016   Procedure: ESOPHAGEAL MANOMETRY (EM);  Surgeon: Iva Boop, MD;  Location: [facility];  Service: Endoscopy;  Laterality: N/A;    ESOPHAGOGASTRODUODENOSCOPY (EGD) WITH ESOPHAGEAL DILATION  08/30/2016   MOUTH SURGERY     PH IMPEDANCE STUDY N/A 12/15/2016   Procedure: PH IMPEDANCE STUDY;  Surgeon: Iva Boop, MD;  Location: [facility];  Service: Endoscopy;  Laterality: N/A;   WISDOM TOOTH EXTRACTION      Review of systems negative except as noted in HPI / PMHx or noted below:  Review of Systems  Constitutional: Negative.   HENT: Negative.    Eyes: Negative.   Respiratory: Negative.    Cardiovascular: Negative.   Gastrointestinal: Negative.   Genitourinary: Negative.   Musculoskeletal: Negative.   Skin: Negative.   Neurological: Negative.   Endo/Heme/Allergies: Negative.   Psychiatric/Behavioral: Negative.       Objective:   Vitals:   02/01/23 1530  BP: 100/60  Pulse: 84  Temp: 98.7 F (37.1 C)  SpO2: 97%   Height: 5\' 3"  (160 cm)  Weight: 130 lb 6.4 oz (59.1 kg)   Physical Exam Constitutional:      Appearance: She is not diaphoretic.  HENT:     Head: Normocephalic.     Right Ear: Tympanic membrane, ear canal and external ear normal.     Left Ear: Tympanic membrane, ear canal and external ear normal.     Nose: Nose normal. No mucosal edema or rhinorrhea.     Mouth/Throat:     Pharynx: Uvula midline. No oropharyngeal exudate.  Eyes:     Conjunctiva/sclera: Conjunctivae normal.  Neck:     Thyroid: No thyromegaly.     Trachea: Trachea normal. No tracheal tenderness or tracheal deviation.  Cardiovascular:     Rate and Rhythm: Normal rate and regular rhythm.     Heart sounds: Normal heart sounds, S1 normal and S2 normal. No murmur heard. Pulmonary:     Effort: No respiratory distress.     Breath sounds: Normal breath sounds. No stridor. No wheezing or rales.  Lymphadenopathy:     Head:     Right side of head: No tonsillar adenopathy.     Left side of head: No tonsillar adenopathy.     Cervical: No cervical adenopathy.  Skin:    Findings: No erythema or rash.     Nails: There is no  clubbing.  Neurological:     Mental Status: She is alert.     Diagnostics: Spirometry was performed and demonstrated an FEV1 of 3.11 at 98 % of predicted.  Assessment and Plan:   1. Asthma, severe persistent, well-controlled   2. Other allergic rhinitis   3. Pollen-food allergy, subsequent encounter     1. If needed:  A. Levo cetirizine 5 mg - 1 tablet 1 time per day B. Montelukast 10 mg - 1 tablet 1 time per day C. AIRSUPRA - 2 inhalations every 4- 6 hours (Coupon)  2. Plan for fall flu vaccine  3. Return to clinic in 12 months or earlier if problem   Almas appears to  be doing quite well and it is interesting to note that after her pregnancy her immune system changed dramatically and she has very little problems with asthma and very little problems with her nose and very little problems with her previous oral allergy syndrome.  For now we are going to have her use levocetirizine and montelukast and a combination anti-inflammatory rescue medicine should it be required.  If she does well I will see her back in this clinic in 1 year or earlier if there is a problem.  Laurette Schimke, MD Allergy / Immunology [facility]

## 2023-02-01 NOTE — Patient Instructions (Addendum)
  1. If needed:  A. Levo cetirizine 5 mg - 1 tablet 1 time per day B. Montelukast 10 mg - 1 tablet 1 time per day C. AIRSUPRA - 2 inhalations every 4- 6 hours (Coupon)  2. Plan for fall flu vaccine  3. Return to clinic in 12 months or earlier if problem

## 2023-02-02 ENCOUNTER — Encounter: Payer: Self-pay | Admitting: Allergy and Immunology

## 2023-02-03 NOTE — Addendum Note (Signed)
Addended by: Philipp Deputy on: 02/03/2023 05:56 PM   Modules accepted: Orders

## 2023-04-25 LAB — OB RESULTS CONSOLE HIV ANTIBODY (ROUTINE TESTING): HIV: NONREACTIVE

## 2023-04-25 LAB — OB RESULTS CONSOLE HEPATITIS B SURFACE ANTIGEN: Hepatitis B Surface Ag: NEGATIVE

## 2023-04-25 LAB — OB RESULTS CONSOLE RUBELLA ANTIBODY, IGM: Rubella: IMMUNE

## 2023-04-25 LAB — HEPATITIS C ANTIBODY: HCV Ab: NEGATIVE

## 2023-04-25 LAB — OB RESULTS CONSOLE GC/CHLAMYDIA
Chlamydia: NEGATIVE
Neisseria Gonorrhea: NEGATIVE

## 2023-07-13 NOTE — L&D Delivery Note (Signed)
   Delivery Note:   G3P1011 at [redacted]w[redacted]d  Admitting diagnosis: Normal labor [O80, Z37.9] Risks: Rh negative, s/p Rhogam at 28 weeks Onset of labor: 11/19/2023 at 0800 IOL/Augmentation: AROM ROM: 11/19/2023 at 1933, clear fluid  Complete dilation at 11/19/2023 2045 Onset of pushing at 2045 FHR second stage reassuring via Doppler  Analgesia/Anesthesia intrapartum:Local Pushing in reclining position in waterbirth tub with CNM and L&D staff support. Husband, [place], present for birth and supportive.  Delivery of a Live born female  Birth Weight:  pending APGAR: 8, 9   Newborn Delivery   Birth date/time: 11/19/2023 21:00:54 Delivery type: Vaginal, Spontaneous    in cephalic presentation, position OA to LOA.  APGAR:1 min-8 , 5 min-9   Nuchal Cord: No  Cord double clamped after cessation of pulsation, cut by [place].  Collection of cord blood for typing completed. Arterial cord blood sample-    Placenta delivered-Spontaneous with 3 vessels. Uterotonics: None Placenta to L&D Uterine tone firm  Bleeding scant  Labial laceration identified.  Episiotomy:None Local analgesia: Lidocaine   Repair: 3-0 in figure of eight to bring skin edges together Est. Blood Loss (mL):55.00  Complications: None  Mom to postpartum. Baby Emmie to Couplet care / Skin to Skin.  Delivery Report:   Review the Delivery Report for details.    Joel Murphy, CNM, MSN 11/19/2023, 9:24 PM

## 2023-09-14 LAB — OB RESULTS CONSOLE RPR
RPR: NONREACTIVE
RPR: NONREACTIVE

## 2023-10-27 LAB — OB RESULTS CONSOLE GBS: GBS: NEGATIVE

## 2023-11-17 ENCOUNTER — Inpatient Hospital Stay (HOSPITAL_COMMUNITY)
Admission: AD | Admit: 2023-11-17 | Discharge: 2023-11-17 | Disposition: A | Discharge status: Home / Self Care | Payer: Self-pay | Attending: Obstetrics and Gynecology | Admitting: Obstetrics and Gynecology

## 2023-11-17 ENCOUNTER — Encounter (HOSPITAL_COMMUNITY): Payer: Self-pay

## 2023-11-17 DIAGNOSIS — O471 False labor at or after 37 completed weeks of gestation: Secondary | ICD-10-CM | POA: Insufficient documentation

## 2023-11-17 DIAGNOSIS — Z3A39 39 weeks gestation of pregnancy: Secondary | ICD-10-CM | POA: Insufficient documentation

## 2023-11-17 DIAGNOSIS — O479 False labor, unspecified: Secondary | ICD-10-CM

## 2023-11-17 NOTE — MAU Note (Signed)
 Elizabeth Booker is a 28 y.o. at [redacted]w[redacted]d here in MAU reporting: ctx every 1.5-2 minutes apart. Pt states ctx started around 0200. Pt denies LOF or VB. +FM   Onset of complaint: 0200 Pain score: 6/10 lower abdomen and lower back  SVE last thurs 1cm  Vitals:   11/17/23 0424  BP: 114/74  Pulse: 73  Resp: 18  Temp: (!) 97.4 F (36.3 C)  SpO2: 97%     FHT:124 Lab orders placed from triage:  mau labor

## 2023-11-17 NOTE — MAU Provider Note (Signed)
 S: Ms. Quenna N Bucks is a 28 y.o. G3P1011 at [redacted]w[redacted]d  who presents to MAU today for labor evaluation.     Cervical exam by RN:  Dilation: 1 Effacement (%): 60 Station: -3 Presentation: Vertex  Fetal Monitoring: Baseline: 140 Variability: average Accelerations: present Decelerations: absent Contractions: irregular   MDM Discussed patient with RN. NST reviewed.  RN Discussed patient with Warren Haber CNM who instructed her to discharge patient  A: SIUP at [redacted]w[redacted]d  False labor  P: Discharge home Labor precautions and kick counts included in AVS Patient to follow-up with office as scheduled  Patient may return to MAU as needed or when in labor   Harlee Lichtenstein, CNM 11/17/2023 5:07 AM

## 2023-11-19 ENCOUNTER — Encounter (HOSPITAL_COMMUNITY): Payer: Self-pay | Admitting: Obstetrics

## 2023-11-19 ENCOUNTER — Inpatient Hospital Stay (HOSPITAL_COMMUNITY)
Admission: AD | Admit: 2023-11-19 | Discharge: 2023-11-21 | DRG: 807 | Disposition: A | Discharge status: Home / Self Care | Payer: Self-pay | Source: Ambulatory Visit | Attending: Obstetrics | Admitting: Obstetrics

## 2023-11-19 DIAGNOSIS — O26893 Other specified pregnancy related conditions, third trimester: Principal | ICD-10-CM | POA: Diagnosis present

## 2023-11-19 DIAGNOSIS — Z833 Family history of diabetes mellitus: Secondary | ICD-10-CM

## 2023-11-19 DIAGNOSIS — Z6791 Unspecified blood type, Rh negative: Secondary | ICD-10-CM

## 2023-11-19 DIAGNOSIS — Z3A40 40 weeks gestation of pregnancy: Secondary | ICD-10-CM

## 2023-11-19 DIAGNOSIS — O9902 Anemia complicating childbirth: Secondary | ICD-10-CM | POA: Diagnosis present

## 2023-11-19 LAB — CBC
HCT: 34.5 % — ABNORMAL LOW (ref 36.0–46.0)
Hemoglobin: 12 g/dL (ref 12.0–15.0)
MCH: 30.7 pg (ref 26.0–34.0)
MCHC: 34.8 g/dL (ref 30.0–36.0)
MCV: 88.2 fL (ref 80.0–100.0)
Platelets: 191 10*3/uL (ref 150–400)
RBC: 3.91 MIL/uL (ref 3.87–5.11)
RDW: 13.4 % (ref 11.5–15.5)
WBC: 15 10*3/uL — ABNORMAL HIGH (ref 4.0–10.5)
nRBC: 0 % (ref 0.0–0.2)

## 2023-11-19 LAB — TYPE AND SCREEN
ABO/RH(D): O NEG
Antibody Screen: NEGATIVE

## 2023-11-19 MED ORDER — SOD CITRATE-CITRIC ACID 500-334 MG/5ML PO SOLN: 30.0000 mL | ORAL | Status: DC | PRN

## 2023-11-19 MED ORDER — SODIUM CHLORIDE 0.9 % IV SOLN: INTRAVENOUS | Status: DC | PRN

## 2023-11-19 MED ORDER — DIPHENHYDRAMINE HCL 25 MG PO CAPS: 25.0000 mg | ORAL_CAPSULE | Freq: Four times a day (QID) | ORAL | Status: DC | PRN

## 2023-11-19 MED ORDER — ONDANSETRON HCL 4 MG/2ML IJ SOLN: 4.0000 mg | Freq: Four times a day (QID) | INTRAMUSCULAR | Status: DC | PRN

## 2023-11-19 MED ORDER — SENNOSIDES-DOCUSATE SODIUM 8.6-50 MG PO TABS
2.0000 | ORAL_TABLET | Freq: Every day | ORAL | Status: DC
  Administered 2023-11-20 – 2023-11-21 (×2): 2 via ORAL
  Filled 2023-11-19 (×2): qty 2

## 2023-11-19 MED ORDER — LACTATED RINGERS IV SOLN: 500.0000 mL | INTRAVENOUS | Status: DC | PRN

## 2023-11-19 MED ORDER — SODIUM CHLORIDE 0.9% FLUSH: 3.0000 mL | Freq: Two times a day (BID) | INTRAVENOUS | Status: DC

## 2023-11-19 MED ORDER — SODIUM CHLORIDE 0.9% FLUSH: 3.0000 mL | INTRAVENOUS | Status: DC | PRN

## 2023-11-19 MED ORDER — PRENATAL MULTIVITAMIN CH
1.0000 | ORAL_TABLET | Freq: Every day | ORAL | Status: DC
  Administered 2023-11-20: 1 via ORAL
  Filled 2023-11-19: qty 1

## 2023-11-19 MED ORDER — IBUPROFEN 600 MG PO TABS
600.0000 mg | ORAL_TABLET | Freq: Four times a day (QID) | ORAL | Status: DC
  Administered 2023-11-19 – 2023-11-21 (×7): 600 mg via ORAL
  Filled 2023-11-19 (×7): qty 1

## 2023-11-19 MED ORDER — ZOLPIDEM TARTRATE 5 MG PO TABS: 5.0000 mg | ORAL_TABLET | Freq: Every evening | ORAL | Status: DC | PRN

## 2023-11-19 MED ORDER — BENZOCAINE-MENTHOL 20-0.5 % EX AERO: 1.0000 | INHALATION_SPRAY | CUTANEOUS | Status: DC | PRN

## 2023-11-19 MED ORDER — TETANUS-DIPHTH-ACELL PERTUSSIS 5-2.5-18.5 LF-MCG/0.5 IM SUSY: 0.5000 mL | PREFILLED_SYRINGE | Freq: Once | INTRAMUSCULAR | Status: DC

## 2023-11-19 MED ORDER — DIBUCAINE (PERIANAL) 1 % EX OINT: 1.0000 | TOPICAL_OINTMENT | CUTANEOUS | Status: DC | PRN

## 2023-11-19 MED ORDER — LACTATED RINGERS IV SOLN: INTRAVENOUS | Status: DC

## 2023-11-19 MED ORDER — OXYTOCIN-SODIUM CHLORIDE 30-0.9 UT/500ML-% IV SOLN: 2.5000 [IU]/h | INTRAVENOUS | Status: DC

## 2023-11-19 MED ORDER — FENTANYL CITRATE (PF) 100 MCG/2ML IJ SOLN: 50.0000 ug | INTRAMUSCULAR | Status: DC | PRN

## 2023-11-19 MED ORDER — LIDOCAINE HCL (PF) 1 % IJ SOLN
30.0000 mL | INTRAMUSCULAR | Status: DC | PRN
  Filled 2023-11-19: qty 30

## 2023-11-19 MED ORDER — COCONUT OIL OIL: 1.0000 | TOPICAL_OIL | Status: DC | PRN

## 2023-11-19 MED ORDER — ONDANSETRON HCL 4 MG/2ML IJ SOLN: 4.0000 mg | INTRAMUSCULAR | Status: DC | PRN

## 2023-11-19 MED ORDER — ACETAMINOPHEN 325 MG PO TABS
650.0000 mg | ORAL_TABLET | ORAL | Status: DC | PRN
  Administered 2023-11-20 – 2023-11-21 (×4): 650 mg via ORAL
  Filled 2023-11-19 (×4): qty 2

## 2023-11-19 MED ORDER — ACETAMINOPHEN 500 MG PO TABS: 1000.0000 mg | ORAL_TABLET | Freq: Four times a day (QID) | ORAL | Status: DC | PRN

## 2023-11-19 MED ORDER — WITCH HAZEL-GLYCERIN EX PADS: 1.0000 | MEDICATED_PAD | CUTANEOUS | Status: DC | PRN

## 2023-11-19 MED ORDER — OXYTOCIN 10 UNIT/ML IJ SOLN
10.0000 [IU] | Freq: Once | INTRAMUSCULAR | Status: DC
  Filled 2023-11-19: qty 1

## 2023-11-19 MED ORDER — OXYTOCIN BOLUS FROM INFUSION: 333.0000 mL | Freq: Once | INTRAVENOUS | Status: DC

## 2023-11-19 MED ORDER — SIMETHICONE 80 MG PO CHEW: 80.0000 mg | CHEWABLE_TABLET | ORAL | Status: DC | PRN

## 2023-11-19 MED ORDER — ONDANSETRON HCL 4 MG PO TABS: 4.0000 mg | ORAL_TABLET | ORAL | Status: DC | PRN

## 2023-11-19 NOTE — MAU Note (Signed)
 Pt declines IV but agrees to labs

## 2023-11-19 NOTE — MAU Note (Signed)
..  Elizabeth Booker is a 28 y.o. at [redacted]w[redacted]d here in MAU reporting: ctx every 3 minutes. States they started around 0800 this morning and have gotten closer and more intense. Also having some bloody show. Denies LOF. +FM.   Pain score: 8 Vitals:   11/19/23 1654  BP: 124/69  Pulse: 72  Resp: 16  Temp: 98.5 F (36.9 C)  SpO2: 95%

## 2023-11-19 NOTE — H&P (Addendum)
 OB ADMISSION/ HISTORY & PHYSICAL:  Admission Date: 11/19/2023  4:38 PM  Admit Diagnosis: Normal labor [O80, Z37.9]    Elizabeth Booker is a 28 y.o. female G3P1011 at [redacted]w[redacted]d presenting for labor. Reports contractions all day, now 3-4 minutes apart. Denies leaking of fluid or vaginal bleeding. Endorses + fetal movement. Husband, [place], present and supportive. Eagerly anticipating a baby girl "Emmie".   Prenatal History: G3P1011   EDC: 11/18/2023 Prenatal care at [facility] since 10 weeks  Primary: A. Rochelle Chu, CNM  Prenatal course complicated by: History of SGA, G1 was 5lb 8oz at birth, AGA in the 3T, last growth 53% at 34 weeks History of hypothyroidism, was on meds PP after G1, then stopped, TFTs WNL this pregnancy Rh negative, fetus Rh positive, Rhogam at 28 weeks Anemia, on PO iron  Prenatal Labs: ABO, Rh:   O NEG Antibody:   Negative Rubella:   Immune RPR:   Non-reactive HBsAg:   Negative HIV:   Negative GBS: Negative/-- (04/17 0000)  1 hr Glucola : 110 Genetic Screening: Low risk Panorama XX Ultrasound: normal XX anatomy, anterior placenta, EFW 5lb 8oz, 53%    Maternal Diabetes: No Genetic Screening: Normal Maternal Ultrasounds/Referrals: Normal Fetal Ultrasounds or other Referrals:  None Maternal Substance Abuse:  No Significant Maternal Medications:  None Significant Maternal Lab Results:  Group B Strep negative and Rh negative Other Comments:  None  Medical / Surgical History : Past medical history:  Past Medical History:  Diagnosis Date   Allergy    Asthma    GERD (gastroesophageal reflux disease)    Kidney infection    Migraines    Vasovagal near-syncope     Past surgical history:  Past Surgical History:  Procedure Laterality Date   ESOPHAGEAL MANOMETRY N/A 12/15/2016   Procedure: ESOPHAGEAL MANOMETRY (EM);  Surgeon: Kenney Peacemaker, MD;  Location: [facility];  Service: Endoscopy;  Laterality: N/A;   ESOPHAGOGASTRODUODENOSCOPY (EGD) WITH ESOPHAGEAL  DILATION  08/30/2016   MOUTH SURGERY     PH IMPEDANCE STUDY N/A 12/15/2016   Procedure: PH IMPEDANCE STUDY;  Surgeon: Kenney Peacemaker, MD;  Location: [facility];  Service: Endoscopy;  Laterality: N/A;   WISDOM TOOTH EXTRACTION      Family History:  Family History  Problem Relation Age of Onset   High blood pressure Mother    High blood pressure Father    Schizophrenia Paternal Grandfather    Diabetes Maternal Grandmother    Colon cancer Neg Hx    Esophageal cancer Neg Hx    Rectal cancer Neg Hx    Stomach cancer Neg Hx    Allergic rhinitis Neg Hx    Angioedema Neg Hx    Asthma Neg Hx    Atopy Neg Hx    Eczema Neg Hx    Immunodeficiency Neg Hx    Urticaria Neg Hx    Migraines Neg Hx    Pancreatic cancer Neg Hx     Social History:  reports that she has never smoked. She has never used smokeless tobacco. She reports that she does not drink alcohol and does not use drugs.  Allergies: Iohexol and Latex   Current Medications at time of admission:  Facility-Administered Medications Prior to Admission  Medication Dose Route Frequency Provider Last Rate Last Admin   Mepolizumab  SOLR 100 mg  100 mg Subcutaneous Q28 days Kozlow, Eric J, MD   100 mg at 06/20/18 1715   Medications Prior to Admission  Medication Sig Dispense Refill Last Dose/Taking  albuterol  (VENTOLIN  HFA) 108 (90 Base) MCG/ACT inhaler INHALE 2 PUFFS BY MOUTH EVERY 6 HOURS AS NEEDED FOR WHEEZINGFOR SHORTNESS OF BREATH 18 g 3 Past Month   Albuterol -Budesonide  (AIRSUPRA ) 90-80 MCG/ACT AERO Inhale 2 Inhalations into the lungs every 4 (four) hours as needed. 11 g 1 Past Month   ferrous sulfate 325 (65 FE) MG EC tablet Take 325 mg by mouth 3 (three) times daily with meals.   11/18/2023 Evening   fluticasone  (FLONASE ) 50 MCG/ACT nasal spray Place 1-2 sprays into both nostrils as needed. 16 g 8 Past Month   levocetirizine (XYZAL ) 5 MG tablet Take 1 tablet (5 mg total) by mouth daily as needed for allergies (Can take an extra  dose during flare ups.). 180 tablet 3 11/18/2023 Evening   Prenatal Vit-Fe Fumarate-FA (PRENATAL MULTIVITAMIN) TABS tablet Take 1 tablet by mouth daily at 12 noon.   11/18/2023 Evening   budesonide  (PULMICORT  FLEXHALER) 180 MCG/ACT inhaler Inhale 2 puffs into the lungs in the morning and at bedtime. (Patient not taking: Reported on 02/01/2023) 1 each 5    budesonide  (RHINOCORT  AQUA) 32 MCG/ACT nasal spray Place 2 sprays into both nostrils daily. (Patient not taking: Reported on 02/01/2023) 9 mL 5    cholecalciferol (VITAMIN D3) 25 MCG (1000 UNIT) tablet Take 1,000 Units by mouth daily. (Patient not taking: Reported on 11/19/2023)   Not Taking   ciclesonide  (ALVESCO ) 160 MCG/ACT inhaler Inhale 1 puff into the lungs 2 (two) times daily. (Patient not taking: Reported on 02/01/2023) 7 g 11    EPINEPHRINE  0.3 mg/0.3 mL IJ SOAJ injection INJECT CONTENTS OF 1 PEN AS NEEDED FOR LIFE-THREATENING ALLERGIC REACTION 2 each 1    Fluticasone -Salmeterol,sensor, (AIRDUO DIGIHALER ) 113-14 MCG/ACT AEPB Inhale 2 puffs into the lungs in the morning and at bedtime. (Patient not taking: Reported on 02/01/2023) 1 each 5    ipratropium-albuterol  (DUONEB) 0.5-2.5 (3) MG/3ML SOLN USE 1 VIAL IN NEBULIZER EVERY 6 HOURS AS NEEDED (Patient not taking: Reported on 02/01/2023) 180 mL 2    montelukast  (SINGULAIR ) 10 MG tablet Take 1 tablet (10 mg total) by mouth at bedtime. TAKE ONE TABLET BY MOUTH ONCE DAILY AS DIRECTED (Patient not taking: Reported on 11/19/2023) 90 tablet 1 Not Taking   Olopatadine HCl (PATANOL OP) Apply to eye. Uses PRN (Patient not taking: Reported on 11/19/2023)   Not Taking    Review of Systems: Review of Systems  All other systems reviewed and are negative.  Physical Exam: Vital signs and nursing notes reviewed.  Patient Vitals for the past 24 hrs:  BP Temp Pulse Resp SpO2 Weight  11/19/23 1654 124/69 98.5 F (36.9 C) 72 16 95 % --  11/19/23 1647 -- -- -- -- -- 73.8 kg    General: AAO x 3, NAD Heart:  RRR Lungs:CTAB Abdomen: Gravid, NT Extremities: no edema SVE: Dilation: 4 Effacement (%): 100 Exam by:: Shalaine Payson CNM   FHR: 130BPM, moderate variability, + accels, no decels TOCO: Contractions q 2-4 minutes  Labs:   No results for input(s): "WBC", "HGB", "HCT", "PLT" in the last 72 hours.  Assessment/Plan: 28 y.o. G3P1011 at [redacted]w[redacted]d, labor Rh negative, fetus Rh positive per Panorama, Rhogam at 28 weeks Anemia, on PO iron  Fetal wellbeing - FHT category 1 EFW AGA 6-7lbs  Labor: Plan AROM prior to tub immersion, Pitocin  prn  GBS negative Rubella immune Rh negative, fetus Rh positive per Panorama  Pain control: desires unmedicated waterbirth, class complete Analgesia/anesthesia PRN  Anticipated MOD: NSVB  Plans to breastfeed.  POC discussed with patient and support team, all questions answered.  Dr. Belle Box notified of admission/plan of care.  Joel Murphy CNM, MSN 11/19/2023, 5:10 PM

## 2023-11-19 NOTE — Progress Notes (Signed)
 S: Working through contractions with husband, [place]. Discussed the R/B/A of AROM for labor augmentation and patient consents to procedure.   O: Vitals:   11/19/23 1647 11/19/23 1654 11/19/23 1729  BP:  124/69 126/79  Pulse:  72 79  Resp:  16 17  Temp:  98.5 F (36.9 C)   SpO2:  95%   Weight: 73.8 kg    Height:   5\' 3"  (1.6 m)   FHT:  FHR: 130 bpm, variability: moderate,  accelerations:  Present,  decelerations:  Absent UC:   regular, every 2-4 minutes SVE:   Dilation: 4.5 Effacement (%): 100 Station: 0 Exam by:: Warren Haber, CNm  AROM of a small amount of clear fluid at 1933.   A / P: Spontaneous labor, progressing normally, AROM for clear fluid  Fetal Wellbeing:  Category I GBS: Negative Pain Control:  Labor support without medications Anticipated MOD:  NSVD  Annemarie Barry, MSN 11/19/2023, 7:36 PM

## 2023-11-20 LAB — CBC
HCT: 35.4 % — ABNORMAL LOW (ref 36.0–46.0)
Hemoglobin: 12.2 g/dL (ref 12.0–15.0)
MCH: 30.4 pg (ref 26.0–34.0)
MCHC: 34.5 g/dL (ref 30.0–36.0)
MCV: 88.3 fL (ref 80.0–100.0)
Platelets: 199 10*3/uL (ref 150–400)
RBC: 4.01 MIL/uL (ref 3.87–5.11)
RDW: 13.2 % (ref 11.5–15.5)
WBC: 18.2 10*3/uL — ABNORMAL HIGH (ref 4.0–10.5)
nRBC: 0 % (ref 0.0–0.2)

## 2023-11-20 LAB — RPR: RPR Ser Ql: NONREACTIVE

## 2023-11-20 MED ORDER — ACETAMINOPHEN 500 MG PO TABS: 1000.0000 mg | ORAL_TABLET | Freq: Four times a day (QID) | ORAL | Status: AC

## 2023-11-20 MED ORDER — RHO D IMMUNE GLOBULIN 1500 UNIT/2ML IJ SOSY
300.0000 ug | PREFILLED_SYRINGE | Freq: Once | INTRAMUSCULAR | Status: AC
  Administered 2023-11-20: 300 ug via INTRAMUSCULAR
  Filled 2023-11-20: qty 2

## 2023-11-20 MED ORDER — IBUPROFEN 200 MG PO TABS
600.0000 mg | ORAL_TABLET | Freq: Four times a day (QID) | ORAL | Status: AC
Start: 2023-11-20 — End: 2023-11-27

## 2023-11-20 MED ORDER — POLYETHYLENE GLYCOL 3350 17 G PO PACK: 17.0000 g | PACK | Freq: Every day | ORAL | Status: AC

## 2023-11-20 NOTE — Lactation Note (Signed)
 This note was copied from a baby's chart. Lactation Consultation Note  Patient Name: Elizabeth Booker [id number] Date: 11/20/2023 Age:27 hours  Attempted to see mom. Rm dark. Mom awake holding baby in laid back position STS.  Introduced self. Mom stated the baby has been wanting to BF a lot but didn't seemed satisfied so she had to give her some formula. Mom stated she BF her 1st child now 95 yrs old for 3 months then pumped and bottle fed BM and formula for a while after she went back to work. Mom stated it was challenging BF her 1st child and it didn't go well because her had a tongue tie that was revised, mom wore NS, pumped and it was hard. Mom stated this baby doesn't BF like she has a tongue tie. She is hoping it goes better. Mom is tired. Mom would like to rest and have Lactation come back sometime today.     Maternal Data    Feeding    LATCH Score                    Lactation Tools Discussed/Used    Interventions    Discharge    Consult Status      Elizabeth Booker 11/20/2023, 6:07 AM

## 2023-11-20 NOTE — Discharge Summary (Signed)
 Postpartum Discharge Summary  Patient Name: Elizabeth Booker DOB: Dec 04, 1995 MRN: [id number]  Date of admission: 11/19/2023 Delivery date:11/19/2023 Delivering provider: Joel Murphy Date of discharge: [id number]   Admitting diagnosis: Normal labor [O80, Z37.9] Intrauterine pregnancy: [redacted]w[redacted]d     Secondary diagnosis:  Principal Problem:   Postpartum care following vaginal delivery 5/10 Active Problems:   Normal labor   SVD/Waterbirth   Right labial laceration with repair   RhD negative  Additional problems: None    Discharge diagnosis: Term Pregnancy Delivered                                              Post partum procedures:rhogam Augmentation: N/A Complications: None  Hospital course: Onset of Labor With Vaginal Delivery      28 y.o. yo [id number] at [redacted]w[redacted]d was admitted in Latent Labor on 11/19/2023. Labor course was complicated by none  Membrane Rupture Time/Date: 7:33 PM,11/19/2023  Delivery Method:Vaginal, Spontaneous Operative Delivery:N/A Episiotomy: None Lacerations:  Labial Patient had a postpartum course complicated by none.  She is ambulating, tolerating a regular diet, passing flatus, and urinating well. Patient is discharged home in stable condition on 11/20/23.  Newborn Data: Birth date:11/19/2023 Birth time:9:00 PM Gender:Female Living status:Living Apgars:8 ,9  Weight:3760 g  Magnesium Sulfate received: No BMZ received: No Rhophylac :Yes MMR:N/A T-DaP:Given prenatally Flu: N/A RSV Vaccine received: No Transfusion:No Immunizations administered: Immunization History  Administered Date(s) Administered   DTaP 04/16/1997, 06/04/1997, 08/06/1997, 05/14/1998   HIB (PRP-T) 04/16/1997, 06/04/1997, 05/14/1998   HPV Quadrivalent 05/28/2009, 09/11/2009, 01/07/2010   Hepatitis A, Ped/Adol-2 Dose 04/13/2006, 05/28/2009   Hepatitis B, PED/ADOLESCENT 09/22/96, 03/14/1997, 11/06/1997   IPV 04/16/1997, 06/04/1997, 02/12/1998, 04/19/2002   Influenza Split 04/19/2002,  05/10/2006, 05/09/2009, 05/15/2010, 06/16/2011, 06/26/2012   Influenza, Seasonal, Injecte, Preservative Fre 04/11/2016   MMR 02/12/1998, 04/19/2002   Meningococcal Conjugate 02/12/2008, 09/26/2013   Pneumococcal Conjugate-13 02/18/2000   Tdap 02/12/2008   Varicella 02/12/1998, 02/12/2008    Physical exam  Vitals:   11/19/23 2240 11/19/23 2254 11/20/23 0000 11/20/23 0449  BP: 113/62 109/65 113/70 (!) 95/57  Pulse: 89 67 81 (!) 56  Resp:  18 18 18   Temp:  98.4 F (36.9 C) 98.2 F (36.8 C) 97.6 F (36.4 C)  TempSrc:  Oral Oral Oral  SpO2:  97% 98% 99%  Weight:      Height:       General: alert, cooperative, and no distress Lochia: appropriate Uterine Fundus: firm Incision: N/A DVT Evaluation: No evidence of DVT seen on physical exam. Labs: Lab Results  Component Value Date   WBC 18.2 (H) 11/20/2023   HGB 12.2 11/20/2023   HCT 35.4 (L) 11/20/2023   MCV 88.3 11/20/2023   PLT 199 11/20/2023      Latest Ref Rng & Units 08/13/2020    8:54 AM  CMP  Glucose 65 - 99 mg/dL 90   BUN 6 - 20 mg/dL 13   Creatinine 6.57 - 1.00 mg/dL 8.46   Sodium 962 - 952 mmol/L 140   Potassium 3.5 - 5.2 mmol/L 4.3   Chloride 96 - 106 mmol/L 105   CO2 20 - 29 mmol/L 22   Calcium 8.7 - 10.2 mg/dL 9.3   Total Protein 6.0 - 8.5 g/dL 7.4   Total Bilirubin 0.0 - 1.2 mg/dL 0.3   Alkaline Phos 44 - 121 IU/L 61   AST 0 -  40 IU/L 18   ALT 0 - 32 IU/L 13    Edinburgh Score:    11/19/2023   10:55 PM  Edinburgh Postnatal Depression Scale Screening Tool  I have been able to laugh and see the funny side of things. --      After visit meds:  Allergies as of 11/20/2023       Reactions   Iohexol Swelling   Throat swelling oral , per Dr. Kareen Osier per Pre meds with IV contrast or do scan without //rls   Latex         Medication List     STOP taking these medications    AirDuo Digihaler  113-14 MCG/ACT Aepb Generic drug: Fluticasone -Salmeterol(sensor)   Alvesco  160 MCG/ACT inhaler Generic  drug: ciclesonide    budesonide  32 MCG/ACT nasal spray Commonly known as: RHINOCORT  AQUA   cholecalciferol 25 MCG (1000 UNIT) tablet Commonly known as: VITAMIN D3   ipratropium-albuterol  0.5-2.5 (3) MG/3ML Soln Commonly known as: DUONEB   montelukast  10 MG tablet Commonly known as: SINGULAIR    PATANOL OP   Pulmicort  Flexhaler 180 MCG/ACT inhaler Generic drug: budesonide        TAKE these medications    acetaminophen  500 MG tablet Commonly known as: TYLENOL  Take 2 tablets (1,000 mg total) by mouth every 6 (six) hours for 7 days.   Airsupra  90-80 MCG/ACT Aero Generic drug: Albuterol -Budesonide  Inhale 2 Inhalations into the lungs every 4 (four) hours as needed.   albuterol  108 (90 Base) MCG/ACT inhaler Commonly known as: VENTOLIN  HFA INHALE 2 PUFFS BY MOUTH EVERY 6 HOURS AS NEEDED FOR WHEEZINGFOR SHORTNESS OF BREATH   EPINEPHrine  0.3 mg/0.3 mL Soaj injection Commonly known as: EPI-PEN INJECT CONTENTS OF 1 PEN AS NEEDED FOR LIFE-THREATENING ALLERGIC REACTION   ferrous sulfate 325 (65 FE) MG EC tablet Take 325 mg by mouth 3 (three) times daily with meals.   fluticasone  50 MCG/ACT nasal spray Commonly known as: FLONASE  Place 1-2 sprays into both nostrils as needed.   ibuprofen  200 MG tablet Commonly known as: Advil  Take 3 tablets (600 mg total) by mouth every 6 (six) hours for 7 days.   levocetirizine 5 MG tablet Commonly known as: XYZAL  Take 1 tablet (5 mg total) by mouth daily as needed for allergies (Can take an extra dose during flare ups.).   polyethylene glycol 17 g packet Commonly known as: MiraLax Take 17 g by mouth daily.   prenatal multivitamin Tabs tablet Take 1 tablet by mouth daily at 12 noon.         Discharge home in stable condition Infant Feeding: Breast Infant Disposition:home with mother Discharge instruction: per After Visit Summary and Postpartum booklet. Activity: Advance as tolerated. Pelvic rest for 6 weeks.  Diet: routine  diet Anticipated Birth Control: Unsure Postpartum Appointment:6 weeks Additional Postpartum F/U: None Future Appointments: Future Appointments  Date Time Provider Department Center  02/07/2024  1:30 PM Kozlow, Rema Care, MD [facility] None   Follow up Visit:  Follow-up Information     [facility], [facility]. Schedule an appointment as soon as possible for a visit in 6 week(s).   Contact information: [address] [place] [address] [phone]                     11/20/2023 Delberta Fee, MD

## 2023-11-20 NOTE — Lactation Note (Signed)
 This note was copied from a baby's chart. Lactation Consultation Note  Patient Name: Girl Laquitta Raczynski [id number] Date: 11/20/2023 Age:27 hours Reason for consult: Initial assessment;Term  P2, Experienced with breastfeeding. Mother unswaddled baby and placed skin to skin and then latched with ease.  Repositioned for depth. Feed on demand with cues.  Goal 8-12+ times per day after first 24 hrs.  Place baby STS if not cueing.  Flanged lips.  Intermittent swallows noted.  Discussed feeding on demand and cluster feeding.   Maternal Data Has patient been taught Hand Expression?: Yes Does the patient have breastfeeding experience prior to this delivery?: Yes How long did the patient breastfeed?: 13 mos. (breast and formula fed for one month then exclusively breastfed for 13 mos.)  Feeding Mother's Current Feeding Choice: Breast Milk  LATCH Score Latch: Grasps breast easily, tongue down, lips flanged, rhythmical sucking.  Audible Swallowing: A few with stimulation  Type of Nipple: Everted at rest and after stimulation  Comfort (Breast/Nipple): Soft / non-tender  Hold (Positioning): Assistance needed to correctly position infant at breast and maintain latch.  LATCH Score: 8   Interventions Interventions: Breast feeding basics reviewed;Assisted with latch;Skin to skin;Adjust position;Support pillows;Education;LC Services brochure;CDC milk storage guidelines  Discharge Pump: Personal;DEBP (Spectra )  Consult Status Consult Status: Follow-up Date: 11/21/23 Follow-up type: In-patient   Vicenta Graft Boschen  RN, IBCLC 11/20/2023, 10:08 AM

## 2023-11-20 NOTE — Progress Notes (Signed)
 Postpartum Progress Note  PPD#1 s/p SVD  S: Patient seen and examined at bedside. Reports feeling overall well. Pain well controlled, ambulating, tolerating regular diet, voiding without issue. Passing flatus. Denies fever, chills, chest pain, shortness of breath.  Feeding: plans to breastfeed, plans to bottle feed Circ: N/A - female neonate  O:  Vitals:   11/20/23 0000 11/20/23 0449  BP: 113/70 (!) 95/57  Pulse: 81 (!) 56  Resp: 18 18  Temp: 98.2 F (36.8 C) 97.6 F (36.4 C)  SpO2: 98% 99%    PE:  GA: well appearing, NAD CV: RRR, normal S1, S2 Lungs: CTAB Abd: soft, appropriately tender, fundus firm below umbilicus Peri: moderate lochia Ext: no TTP, +1 non-pitting edema  Labs:  Lab Results  Component Value Date   WBC 18.2 (H) 11/20/2023   HGB 12.2 11/20/2023   HCT 35.4 (L) 11/20/2023   MCV 88.3 11/20/2023   PLT 199 11/20/2023   Lab Results  Component Value Date   CREATININE 0.87 08/13/2020    A/P:  28 y.o.yo [id number] PPD#1 s/p SVD (EBL 55 mL) with hypothyroidism, doing well and progressing appropriately. Vitals within normal limits. Physical exam benign. Labs normal. Plan as follows:   #Routine OB - Regular diet, HLIV - ERAS for pain control  - Rh negative, plan to give Rhogam  - DVT ppx: ambulating - BCM: Unsure  #Neonate -plans to breastfeed, plans to bottle feed   Anticipate discharge home tomorrow PPD#2 (delivered at 2100 last night).  Ellsworth Haas, MD

## 2023-11-20 NOTE — Lactation Note (Signed)
 This note was copied from a baby's chart. Lactation Consultation Note  Patient Name: Elizabeth Booker Date: 11/20/2023 Age:27 hours  Attempted to see mom but everyone was sleeping.   Maternal Data    Feeding    LATCH Score                    Lactation Tools Discussed/Used    Interventions    Discharge    Consult Status      Lesly Joslyn G 11/20/2023, 5:54 AM

## 2023-11-21 LAB — RH IG WORKUP (INCLUDES ABO/RH)
Fetal Screen: NEGATIVE
Gestational Age(Wks): 40.1
Unit division: 0

## 2023-11-21 NOTE — Lactation Note (Signed)
 This note was copied from a baby's chart. Lactation Consultation Note  Patient Name: Elizabeth Booker [id number] Date: 11/21/2023 Age:27 hours Reason for consult: Follow-up assessment (attempted to see dyad for D/C , and the family was sleeping. will F/U)   Maternal Data    Feeding Mother's Current Feeding Choice: Breast Milk   Consult Status Consult Status: Follow-up Date: 11/21/23 Follow-up type: In-patient    Renda Carpen Merion Caton 11/21/2023, 8:40 AM

## 2023-11-21 NOTE — Lactation Note (Signed)
 This note was copied from a baby's chart. Lactation Consultation Note  Patient Name: Elizabeth Booker [id number] Date: 11/21/2023 Age:27 hours, P 2 experienced breast feeder  Reason for consult: Follow-up assessment;Infant weight loss;Term;Breastfeeding assistance (8 % weight loss,) As LC entered the room the baby was latched on the right breast with depth. Swallows noted and per  mom comfortable on the right breast.  Per mom having some soreness on the left breast with latch.  After Dr. Laurita Porta of baby, mom latched on the baby on the left with pinching. Mom had used the cradle position and LC had her release the latch, so the baby could latch  with one arm under the breast and one on top for a deeper latch and per mom more comfortable.  LC recommended getting in the habit of rotating between at least 2 breast feeding positions so to enhance the milk volume, prevent soreness, and prevent mastitis. Mom has mention she had mastitis several times with her 1st baby,  LC reviewed engorgement prevention and tx. LC provided a hand pump and shells due to soreness.  Mom aware of storage of breast milk and Lactation resources.    Maternal Data Has patient been taught Hand Expression?: Yes Does the patient have breastfeeding experience prior to this delivery?: Yes  Feeding Mother's Current Feeding Choice: Breast Milk  LATCH Score Latch: Grasps breast easily, tongue down, lips flanged, rhythmical sucking.  Audible Swallowing: Spontaneous and intermittent  Type of Nipple: Everted at rest and after stimulation  Comfort (Breast/Nipple): Filling, red/small blisters or bruises, mild/mod discomfort  Hold (Positioning): Assistance needed to correctly position infant at breast and maintain latch.  LATCH Score: 8   Lactation Tools Discussed/Used Tools: Shells;Pump;Flanges Flange Size: 24 Breast pump type: Manual Pump Education: Setup, frequency, and cleaning;Milk Storage Reason for Pumping:  PRN  Interventions Interventions: Breast feeding basics reviewed;Assisted with latch;Skin to skin;Breast massage;Hand express;Pre-pump if needed;Reverse pressure;Breast compression;Adjust position;Support pillows;Position options;Shells;Hand pump;Education;LC Services brochure;CDC milk storage guidelines;CDC Guidelines for Breast Pump Cleaning  Discharge Discharge Education: Engorgement and breast care;Warning signs for feeding baby Pump: Personal;Hands Free;DEBP;Manual WIC Program: No  Consult Status Consult Status: Complete Date: 11/21/23 Follow-up type: In-patient    Renda Carpen Nate Common 11/21/2023, 11:58 AM

## 2023-11-21 NOTE — Progress Notes (Signed)
 Postpartum Progress Note  PPD#2  s/p SVD, [id number], girl   S: Patient seen and examined at bedside. Reports feeling overall well. Pain well controlled, ambulating, tolerating regular diet, voiding without issue. Passing flatus. Denies fever, chills, chest pain, shortness of breath.  Girl, Feeding: plans to breastfeed, plans to bottle feed   O:  Vitals:   11/20/23 2111 11/21/23 0328  BP: (!) 109/50 97/60  Pulse: 74 (!) 59  Resp: 18 16  Temp: 97.7 F (36.5 C) 97.7 F (36.5 C)  SpO2: 97% 96%    PE:  GA: well appearing, NAD CV: RRR, normal S1, S2 Lungs: CTAB Abd: soft, appropriately tender, fundus firm below umbilicus Peri: moderate lochia Ext: no TTP, +1 non-pitting edema     Latest Ref Rng & Units 11/20/2023    4:40 AM 11/19/2023    6:07 PM 01/08/2022    4:37 AM  CBC  WBC 4.0 - 10.5 K/uL 18.2  15.0  17.1   Hemoglobin 12.0 - 15.0 g/dL 45.4  09.8  11.9   Hematocrit 36.0 - 46.0 % 35.4  34.5  30.1   Platelets 150 - 400 K/uL 199  191  179      A/P:  28 y.o.yo [id number] PPD# 2 s/p SVD (EBL 55 mL) with hypothyroidism, doing well and progressing appropriately. Vitals within normal limits. Physical exam benign. Labs normal. Plan as follows:   #Routine OB - Regular diet - Rh negative, plan to give Rhogam since baby Rh pos - DVT ppx: ambulating - BCM: Unsure  #Neonate -plans to breastfeed, plans to bottle feed   Discharge home, pt is ready. Reviewed pp care and warning signs, call parameters. No depression/ anxiety, good support F/up CNM Jones at 6 weeks   -Terri Fester MD

## 2023-12-06 ENCOUNTER — Telehealth (HOSPITAL_COMMUNITY): Payer: Self-pay | Admitting: *Deleted

## 2023-12-06 NOTE — Telephone Encounter (Signed)
 12/06/2023  Name: Rozanna N Mahaffy MRN: [id number] DOB: 12/01/1995  Reason for Call:  Transition of Care Hospital Discharge Call  Contact Status: Patient Contact Status: Message  Language assistant needed:          Follow-Up Questions:    Dimple Francis Postnatal Depression Scale:  In the Past 7 Days:    PHQ2-9 Depression Scale:     Discharge Follow-up:    Post-discharge interventions: NA  Jericha Bryden,RN 12/06/2023 1340

## 2024-01-27 ENCOUNTER — Encounter: Payer: Self-pay | Admitting: Advanced Practice Midwife

## 2024-02-07 ENCOUNTER — Ambulatory Visit: Payer: BC Managed Care – PPO | Admitting: Allergy and Immunology

## 2024-02-07 ENCOUNTER — Inpatient Hospital Stay (HOSPITAL_COMMUNITY): Admission: RE | Admit: 2024-02-07 | Payer: Self-pay | Source: Home / Self Care | Admitting: Radiation Oncology
